# Patient Record
Sex: Male | Born: 1950 | Race: White | Hispanic: No | Marital: Single | Smoking: Never smoker
Health system: Southern US, Community
[De-identification: ages and names within clinical notes are randomized; demographics above are authoritative.]

## PROBLEM LIST (undated history)

## (undated) DIAGNOSIS — E785 Hyperlipidemia, unspecified: Secondary | ICD-10-CM

## (undated) DIAGNOSIS — K219 Gastro-esophageal reflux disease without esophagitis: Secondary | ICD-10-CM

## (undated) DIAGNOSIS — I4510 Unspecified right bundle-branch block: Secondary | ICD-10-CM

## (undated) DIAGNOSIS — Z955 Presence of coronary angioplasty implant and graft: Secondary | ICD-10-CM

## (undated) DIAGNOSIS — E7800 Pure hypercholesterolemia, unspecified: Secondary | ICD-10-CM

## (undated) DIAGNOSIS — K4090 Unilateral inguinal hernia, without obstruction or gangrene, not specified as recurrent: Secondary | ICD-10-CM

## (undated) DIAGNOSIS — M1990 Unspecified osteoarthritis, unspecified site: Secondary | ICD-10-CM

## (undated) DIAGNOSIS — M1A9XX0 Chronic gout, unspecified, without tophus (tophi): Secondary | ICD-10-CM

## (undated) DIAGNOSIS — I10 Essential (primary) hypertension: Secondary | ICD-10-CM

## (undated) DIAGNOSIS — I2510 Atherosclerotic heart disease of native coronary artery without angina pectoris: Secondary | ICD-10-CM

## (undated) DIAGNOSIS — M109 Gout, unspecified: Secondary | ICD-10-CM

## (undated) DIAGNOSIS — I209 Angina pectoris, unspecified: Secondary | ICD-10-CM

## (undated) DIAGNOSIS — I2089 Other forms of angina pectoris: Secondary | ICD-10-CM

## (undated) DIAGNOSIS — I208 Other forms of angina pectoris: Secondary | ICD-10-CM

## (undated) HISTORY — PX: CORONARY ANGIOPLASTY: SHX604

## (undated) HISTORY — DX: Other forms of angina pectoris: I20.8

## (undated) HISTORY — PX: CORONARY ANGIOPLASTY WITH STENT PLACEMENT: SHX49

---

## 1998-07-20 ENCOUNTER — Inpatient Hospital Stay (HOSPITAL_COMMUNITY): Admission: EM | Admit: 1998-07-20 | Discharge: 1998-07-22 | Payer: Self-pay | Admitting: Emergency Medicine

## 1998-07-20 ENCOUNTER — Encounter: Payer: Self-pay | Admitting: Emergency Medicine

## 1998-07-29 ENCOUNTER — Inpatient Hospital Stay (HOSPITAL_COMMUNITY): Admission: AD | Admit: 1998-07-29 | Discharge: 1998-07-31 | Payer: Self-pay | Admitting: Cardiovascular Disease

## 1998-07-31 ENCOUNTER — Encounter: Payer: Self-pay | Admitting: Cardiovascular Disease

## 2001-08-08 ENCOUNTER — Inpatient Hospital Stay (HOSPITAL_COMMUNITY): Admission: EM | Admit: 2001-08-08 | Discharge: 2001-08-10 | Payer: Self-pay | Admitting: Emergency Medicine

## 2001-08-08 ENCOUNTER — Encounter: Payer: Self-pay | Admitting: Cardiovascular Disease

## 2001-08-09 ENCOUNTER — Encounter: Payer: Self-pay | Admitting: Cardiovascular Disease

## 2003-12-28 ENCOUNTER — Emergency Department (HOSPITAL_COMMUNITY): Admission: EM | Admit: 2003-12-28 | Discharge: 2003-12-28 | Payer: Self-pay | Admitting: *Deleted

## 2005-11-09 ENCOUNTER — Emergency Department (HOSPITAL_COMMUNITY): Admission: EM | Admit: 2005-11-09 | Discharge: 2005-11-09 | Payer: Self-pay | Admitting: Emergency Medicine

## 2006-01-06 ENCOUNTER — Emergency Department (HOSPITAL_COMMUNITY): Admission: EM | Admit: 2006-01-06 | Discharge: 2006-01-06 | Payer: Self-pay | Admitting: Emergency Medicine

## 2006-01-19 ENCOUNTER — Inpatient Hospital Stay (HOSPITAL_COMMUNITY): Admission: RE | Admit: 2006-01-19 | Discharge: 2006-01-19 | Payer: Self-pay | Admitting: Cardiovascular Disease

## 2006-05-01 ENCOUNTER — Inpatient Hospital Stay (HOSPITAL_COMMUNITY): Admission: EM | Admit: 2006-05-01 | Discharge: 2006-05-05 | Payer: Self-pay | Admitting: Emergency Medicine

## 2006-05-11 ENCOUNTER — Inpatient Hospital Stay (HOSPITAL_COMMUNITY): Admission: EM | Admit: 2006-05-11 | Discharge: 2006-05-14 | Payer: Self-pay | Admitting: Emergency Medicine

## 2006-05-28 ENCOUNTER — Observation Stay (HOSPITAL_COMMUNITY): Admission: AD | Admit: 2006-05-28 | Discharge: 2006-05-29 | Payer: Self-pay | Admitting: Cardiovascular Disease

## 2007-01-10 ENCOUNTER — Emergency Department (HOSPITAL_COMMUNITY): Admission: EM | Admit: 2007-01-10 | Discharge: 2007-01-10 | Payer: Self-pay | Admitting: Emergency Medicine

## 2007-07-09 ENCOUNTER — Observation Stay (HOSPITAL_COMMUNITY): Admission: EM | Admit: 2007-07-09 | Discharge: 2007-07-10 | Payer: Self-pay | Admitting: Emergency Medicine

## 2010-10-27 NOTE — Discharge Summary (Signed)
[name], [name]             ACCOUNT NO.:  [id number]   MEDICAL RECORD NO.:  [id number]          PATIENT TYPE:  INP   LOCATION:  3739                         FACILITY:  [facility]   PHYSICIAN:  [name], M.D.  DATE OF BIRTH:  19-May-1951   DATE OF ADMISSION:  05/11/2006  DATE OF DISCHARGE:  05/14/2006                               DISCHARGE SUMMARY   PRINCIPAL DIAGNOSES:  1. Native coronary vessel arthrosclerosis.  2. Intermittent coronary syndrome.  3. Anxiety.  4. Hypertension.   DISCHARGE MEDICATIONS:  1. Aspirin 325 mg 1 p.o. daily.  2. Metoprolol 25 mg 1 twice daily.  3. Imdur 60 mg 1 daily.  4. Plavix 75 mg 1 daily.  5. Prevacid 40 mg 1 daily.  6. NitroQuick 0.4 mg 1 sublingual every 5 minutes x3 as needed for      chest pain.  7. Percocet 5/325 mg 1 twice daily as needed.   DISCHARGE ACTIVITIES:  As tolerated.   DISCHARGE DIET:  Low-fat, low-salt diet.   CONDITION ON DISCHARGE:  Improved.   HISTORY:  This 60 year old white male presented with substernal chest  pain along with some right-sided chest pain.  He had approximately three  sublingual nitroglycerin and had recent PTCA and stent placement in his  LAD and diagonal vessel in August 2007 and a PTCA of diagonal vessel in  November 2007.   PHYSICAL EXAMINATION:  VITAL SIGNS:  Pulse 70, respirations 16, blood  pressure 136/84, oxygen saturation 98% on room air, weight 165, and  height 5'6.  GENERAL:  The patient was alert, oriented x3.  HEENT:  Normocephalic, atraumatic, with pupils unequal, reactive to  light.  Extraocular movements intact.  NECK:  No JVD, no carotid bruit, no thyromegaly.  LUNGS:  Clear bilaterally.  HEART:  Normal S1, S2, with grade 2/6 systolic murmur.  ABDOMEN:  Soft.  EXTREMITIES:  No edema, cyanosis, or clubbing.  CNS:  Grossly intact cranial nerves, patient moves all four extremities.   LABORATORY DATA:  Normal hemoglobin, hematocrit, WBC count, platelet  count.  Normal  electrolytes, BUN, creatinine.  INR 1.  EKG was normal  sinus rhythm.  Cardiac enzymes were elevated with minimal CK-MB  elevation and normal troponin-I x2.  Nuclear stress test without  significant ischemia.   HOSPITAL COURSE:  The patient was admitted to telemetry unit, although  he had an elevated total CK, his CK-MB and troponin-I were unremarkable.  The patient underwent nuclear stress test that did not show any  significant reversible ischemia; hence, the patient was discharged home  in satisfactory condition with followup by me in 2 weeks.      [name], M.D.  Electronically Signed     ASK/MEDQ  D:  06/27/2006  T:  06/27/2006  Job:  [id number]

## 2010-10-27 NOTE — Cardiovascular Report (Signed)
[name], [name]             ACCOUNT NO.:  [id number]   MEDICAL RECORD NO.:  [id number]          PATIENT TYPE:  INP   LOCATION:  2922                         FACILITY:  [facility]   PHYSICIAN:  Eduardo Osier. Sharyn Lull, M.D. DATE OF BIRTH:  07-03-1950   DATE OF PROCEDURE:  01/18/2006  DATE OF DISCHARGE:                              CARDIAC CATHETERIZATION   PROCEDURE:  1. Successful PTCA to ostial diagonal one using 2.5 x 8-mm long Voyager      balloon.  2. Successful PTCA to mid-LAD using 2.5 x 12-mm long Voyager balloon.  3. Successful deployment of 3.0 x 13 mm long Cypher drug-eluting stent in      ostial and proximal diagonal one.  4. Successful deployment of 2.75 x 18 mm long Cypher drug-eluting stent in      mid-LAD.  5. Successful post dilatation of this stent using 3.0 x 8-mm long      PowerSail balloon   INDICATIONS FOR PROCEDURE:  [name] is 60 year old white male with past  medical history significant for coronary artery disease and PTCA stenting to  mid-LAD in February of 2000, history of hypercholesteremia, positive family  history of coronary artery disease.  He was admitted by Dr. Algie Coffer as an  outpatient because of recurrent typical anginal chest pain.  The patient  subsequently had cath today by Dr. Algie Coffer which showed bifurcations mid-LAD  stenosis with 85% stenosis in the ostial of diagonal one and 60-70% mid-LAD  InStent restenosis with haziness.  I was called for possible PCI to diagonal  and LAD.  I discussed with the patient, briefly, regarding risks and  benefits of the procedure i.e. death and mild stroke, need for emergency  CABG, risk of restenosis in the range of 20-30%,  local vascular  complications etcetera and consented for the procedure.   DESCRIPTION OF PROCEDURE:  After obtaining the informed consent, a 5-French  arterial sheath was exchanged to 7-French arterial sheath without  difficulty.  Next, an 11-French 3.5 XV guiding catheter was  advanced over  the wire under fluoroscopic guidance up to the ascending aorta.  Wire was  pulled out, the catheter was aspirated and connected to the manifold.  Catheter was further advanced and engaged into left coronary ostium.  Multiple views of this system were obtained.  Findings were as above, i.e.  left main was patent.  LAD has 60-70% InStent and prestent stenosis with  haziness.  Diagonal one was large which has 85% ostial stenosis.  Left  circumflex was patent.  RCA was patent.  LV function was normal.   Interventional procedure successful PTCA to ostial diagonal one was done  using 2.5 x 8-mm long Voyager balloon using double wire technique for  predilatation; and then PTCA to mid-LAD was done using 2.5 x 12-mm long  Voyager balloon using double wire technique as above; and then a 3.0 x 13-mm  long Cypher drug-eluting stent was deployed in the ostial and proximal  diagonal one prior to deployment of the stent.  A 2.75 x 13-mm long Voyager  balloon was placed in LAD.  The stent was pulled  back up to the ostium of  the diagonal and was deployed at 13 atmospheres pressure.  The stent was  postdilated using 3.0 x 8 mm long PowerSail balloon going up to 16  atmospheres of pressure; and then 2.75 x 18 mm long Cypher drug-eluting  stent was deployed at 15 atmospheres pressure.  In mid-LAD stent was  postdilated using same 3.0 x 8-mm long PowerSail balloon going up to 18  atmospheres pressure.  Lesions were dilated in diagonal one from 85% to less  than 10% residual; and in LAD from 70% to less than 10% residual with  excellent TIMI grade 3 distal flow without evidence of dissection or distal  embolization.  The patient received weight-based heparin Integrilin and 600  mg of Plavix during the procedure.  The patient tolerated procedure well.  There are no complications.  The patient was transferred to recovery room in  stable condition.           ______________________________   Eduardo Osier Sharyn Lull, M.D.     MNH/MEDQ  D:  01/18/2006  T:  01/19/2006  Job:  [id number]   cc:   Cath Lab  [name], M.D.

## 2010-10-27 NOTE — Cardiovascular Report (Signed)
[name], [name]             ACCOUNT NO.:  [id number]   MEDICAL RECORD NO.:  [id number]          PATIENT TYPE:  INP   LOCATION:  6529                         FACILITY:  [facility]   PHYSICIAN:  Mohan N. Sharyn Lull, M.D. DATE OF BIRTH:  [birth year]   DATE OF PROCEDURE:  05/01/2006  DATE OF DISCHARGE:                              CARDIAC CATHETERIZATION   PROCEDURE:  Successful PTCA to ostial of diagonal-1 using initially 2.5 x 8  mm long Voyager balloon and then 3.0 x 6 mm long cutting balloon.   INDICATION FOR THE PROCEDURE:  [name] is a 60 year old white male with  past medical history significant for coronary artery disease.  He had PTCA  to LAD in February 2000 and subsequently had restenosis requiring restenosis  and new ostial diagonal-1 stenosis requiring PTCA stenting to mid-LAD and  diagonal-1 in August 2007.  History of hypertension, hypercholesterolemia.  Was admitted by Dr. Algie Coffer this morning because of exertional chest pain  which started around 5 a.m.  He took three sublingual nitro with partial  relief.  Due to typical anginal chest pain and multiple risk factors, the  patient underwent left cardiac catheterization by Dr. Algie Coffer which showed  LAD had 20 to 30% in-stent restenosis diagonal and ostial diagonal-1 is 90%  in-stent focal restenosis.  The circumflex was patent.  RCA was patent.  I  was called for possible PCI to ostial diagonal-1.   Discussed with the patient briefly the catheterization findings and PTCA of  ostial diagonal, its risks and benefits, i.e., death, MI, stroke, need for  emergency CABG and restenosis in the range of 30 to 40%, local vascular  complications, etc., and consented for the procedure.   PROCEDURE:  After obtaining informed consent, a 5-French arterial sheath was  exchanged for a 7-French arterial sheath over the wire without difficulty.  Next, a 3.5, 7-French XB catheter was advanced over the wire under  fluoroscopic guidance to  the ascending aorta.  The wire was pulled out, the  catheter was aspirated and connected to the manifold.  The catheter was  further advanced and engaged into the left coronary ostium.  Multiple views  of the left system were taken.   FINDINGS:  As above.   INTERVENTIONAL PROCEDURES:  Successful PTCA to ostial diagonal-1 was done.  Initially, she was in 2.5 x 8 mm long Voyager balloon as the cutting balloon  could not be tracked down for predilatation, going up to 8 atmospheric  pressure.  Next, PTCA to ostial diagonal was done using 3.0 x 6 mm long  cutting balloon going up to 6 atmospheric pressure.  Multiple invasions were  done.  The lesion was dilated from 90% to less than 20% residual  with excellent TIMI grade 3 distal flow without evidence of dissection nor  distal embolization.  The patient received weight-based Angiomax, 300 mg of  Plavix during the procedure.  The patient tolerated the procedure well.  There were no complications.  The patient was transferred to the recovery  room in stable condition.           ______________________________  Marlane Mingle  Jeoffrey Massed, M.D.     MNH/MEDQ  D:  05/01/2006  T:  05/01/2006  Job:  [id number]   cc:   [name], M.D.  Catheterization Laboratory

## 2010-10-27 NOTE — Discharge Summary (Signed)
NAME:  [name], Donald B.          ACCOUNT NO.:  [id number]   MEDICAL RECORD NO.:  [id number]          PATIENT TYPE:  OBV   LOCATION:  4731                         FACILITY:  [facility]   PHYSICIAN:  [name], M.D.  DATE OF BIRTH:  February 05, 1951   DATE OF ADMISSION:  05/28/2006  DATE OF DISCHARGE:  05/29/2006                               DISCHARGE SUMMARY   DISCHARGE DIAGNOSES:  1. Cardiac chest pain.  2. Anxiety.  3. Native vessel coronary artery disease.  4. Status post angioplasty.   MEDICATIONS ON DISCHARGE:  Aspirin 325 mg one daily; Plavix 75 mg one  daily; lisinopril 5 mg one daily; metoprolol 25 mg one-half tablet twice  daily; Pravachol 40 mg one daily; Imdur 60 mg two daily; Prilosec 20 mg  one daily; Percocet 5/325 mg twice daily; Xanax 0.25 mg one at bedtime;  nitroglycerin 0.4 mg one sublingual every 5 minutes x three as needed  for chest pain.   DISCHARGE DIET:  Low sodium, heart healthy diet.   WOUND CARE:  Not applicable.   DISCHARGE ACTIVITIES:  The patient is to increase activities slowly.   CONDITION ON DISCHARGE:  Improved.   FOLLOW-UP:  Follow-up by Dr. [name], in 2-4 weeks, the patient  to call [phone] for an appointment.   HISTORY:  This 60 year old white male presented with right-sided chest  pain x two days.  The patient had a recent angioplasty and repeat  cardiac catheterization that had shown patent vessels.   PHYSICAL EXAMINATION:  VITAL SIGNS:  Pulse 60, respiratory rate 16,  blood pressure 130/80, oxygen saturation 98% on room air.  Weight  approximately 165 pounds, height 5 feet 6 inches.  GENERAL APPEARANCE:  The patient is alert and oriented x 3.  HEENT:  The patient is normocephalic, atraumatic, with conjunctivae  pink, sclerae nonicteric.  Pupils are unequal but reacting to light.  Extraocular movements are intact.  NECK:  No JVD, no carotid bruit.  Full range of motion of the neck.  LUNGS:  Clear bilaterally.  HEART:   Normal S1, S2, with a grade I/VI systolic murmur.  ABDOMEN:  The abdomen is soft and nontender.  EXTREMITIES:  No clubbing, cyanosis or edema.  CENTRAL NERVOUS SYSTEM:  Cranial nerves are grossly intact.  The patient  moves all four extremities.   LABORATORY INVESTIGATIONS:  Laboratory data revealed a normal  hemoglobin, hematocrit, white blood cell count, platelet count.  Normal  electrolytes, BUN and creatinine.  CK-MB, troponin I normal x two.   EKG with normal sinus rhythm.   Ultrasound of the abdomen negative for gallbladder disease.   HOSPITAL COURSE:  The patient was admitted to the telemetry unit.  His  CK-MB and troponin I were normal x two.  His EKG was with normal sinus  rhythm.  His ultrasound of the abdomen was negative for gallbladder  disease.  The patient was started on Xanax and he ambulated well without  any additional chest pain, and he was discharged home in satisfactory  condition with follow-up by me in two weeks.      [name], M.D.  Electronically  Signed     ASK/MEDQ  D:  09/18/2006  T:  09/19/2006  Job:  [id number]

## 2010-10-27 NOTE — Cardiovascular Report (Signed)
[name], [name]             ACCOUNT NO.:  [id number]   MEDICAL RECORD NO.:  [id number]          PATIENT TYPE:  INP   LOCATION:  3733                         FACILITY:  [facility]   PHYSICIAN:  [name], M.D.  DATE OF BIRTH:  Feb 05, 1951   DATE OF PROCEDURE:  05/03/2006  DATE OF DISCHARGE:                            CARDIAC CATHETERIZATION   PROCEDURE:  Done by Dr. Orpah Cobb.  Left heart catheterization, selective coronary angiography.   INDICATIONS:  This 60 year old white male had recurrent chest pain after  recent PTCA of diagonal vessel.   APPROACH:  Right femoral artery using 4-French sheath and catheters.   COMPLICATIONS:  None.   HEMODYNAMIC DATA:  The aortic pressure was 92/59.   CORONARY ANATOMY:  The left main coronary artery was unremarkable.   Left anterior descending coronary artery:  The left anterior descending  artery was a relatively smaller vessel and had a patent stent in the mid-  vessel area.   The diagonal vessel:  The diagonal vessel had a patent stent.  Pre-stent  ostial 50% narrowing.  This is probably elastic recoil and otherwise had  a good flow.   Left circumflex coronary artery:  The left circumflex artery was  unremarkable.   Right coronary artery:  The right coronary artery was dominant and  unremarkable.   IMPRESSION:  1. Patent diagonal stent with a moderate pre-stent ostial stenosis.  2. Patent left anterior descending stent.   RECOMMENDATIONS:  This patient will continue IV heparin, IV  nitroglycerin, and we will add IV Integrilin and IV fluids as needed.      [name], M.D.  Electronically Signed     ASK/MEDQ  D:  05/03/2006  T:  05/03/2006  Job:  [id number]

## 2010-10-27 NOTE — Discharge Summary (Signed)
[name], [name]             ACCOUNT NO.:  [id number]   MEDICAL RECORD NO.:  [id number]          PATIENT TYPE:  INP   LOCATION:  2922                         FACILITY:  [facility]   PHYSICIAN:  [name], M.D.  DATE OF BIRTH:  11-Aug-1950   DATE OF ADMISSION:  01/18/2006  DATE OF DISCHARGE:  01/19/2006                                 DISCHARGE SUMMARY   PRINCIPAL DIAGNOSES:  1. Multivessel, native vessel coronary artery disease.  2. Anginal pectoris.  3. Percutaneous transluminal coronary artery angioplasty status.  4. Pure hypercholesterolemia.   PRINCIPAL PROCEDURE:  Left heart catheterizations, selective coronary  angiography, left ventricular function study done by Dr. Orpah Cobb on  January 18, 2006.  PTCA and Cypher Stent placement in diagonal and LAD by Dr.  Rinaldo Cloud.   DISCHARGE MEDICATIONS:  1. Aspirin 81 mg 2 daily.  2. Plavix 75 mg 1 daily.  3. Prevacid 40 mg 1 daily.  4. Prilosec 20 mg 1 daily.  5. Imdur 60 mg 1 daily.  6. Nitroglycerin 0.4 mg tablet 1 sublingual every 5 minutes x3 as need for      chest pain.  7. Patient to discontinue Ranexa.   FOLLOWUP:  Follow up with Dr. Algie Coffer in 2 weeks.  Patient to call [phone]  for appointment.   DISCHARGE DIET:  Low-fat, low-salt diet with extra fluid.   DISCHARGE ACTIVITY:  Patient to increase activity slowly.  No driving,  lifting, pulling, pushing, for 1 week.   WOUND CARE INSTRUCTIONS:  Patient to notify if he has right groin pain,  swelling, or discharge.   HISTORY:  This 60 year old white male had a recurrent chest pain in spite of  using more medications including Ranexa.  Patient had a PTCA of the LAD done  in February 2000.   PHYSICAL EXAMINATION:  VITAL SIGNS:  Pulse 76, respirations 18, blood  pressure 149/94, oxygen saturation 98%.  GENERAL:  Height 5 feet, 6 inches and weighs approximately 165 pounds.  Patient is alert and oriented x3.  HEENT:  Patient is normocephalic, atraumatic.   Pupils reactive to light, but  unequal pupils.  NECK:  No JVD.  No carotid bruit.  LUNGS:  Clear bilaterally.  HEART:  Normal S1, S2.  EXTREMITIES:  No edema, cyanosis, or clubbing.   LABORATORY DATA:  Normal hemoglobin, hematocrit, WBC count, platelet count.  Normal PT/INR and PTT.  Normal electrolytes, BUN 18, creatinine borderline  at 1.6.  CK total 121, MB 3.   EKG:  Normal sinus rhythm with left axis deviation and nonspecific ST wave  changes.   Cardiac catheterization shows a significant LAD and diagonal vessel disease.   Angioplasty, by Dr. Rinaldo Cloud, reducing the diagonal vessel, 90% lesion  to 0% with a 3 mm x 13 mm long CYPHER stent, and angioplasty with stent  placement of left anterior descending coronary artery disease reducing it to  0% using a 2.75 mm x 18 mm long Cypher stent.   HOSPITAL COURSE:  Patient was admitted to angioplasty unit after a cardiac  catheterization showing severe LAD and diagonal vessel disease.  PTCA stent  procedure was done by Dr. Rinaldo Cloud where a drug-eluting stent was  placed in the diagonal vessel and LAD vessel using 18 mm long Cypher stent.  Patient had good results and had no significant post procedure complication.  His creatinine remains stable at 1.6.  His activity was increased and he was  discharged home in satisfactory condition with a follow up in 2 weeks.      [name], M.D.  Electronically Signed     ASK/MEDQ  D:  03/12/2006  T:  03/13/2006  Job:  [id number]

## 2010-10-27 NOTE — Discharge Summary (Signed)
[name], [name]             ACCOUNT NO.:  [id number]   MEDICAL RECORD NO.:  [id number]          PATIENT TYPE:  INP   LOCATION:  3733                         FACILITY:  [facility]   PHYSICIAN:  [name], M.D.  DATE OF BIRTH:  [birth year]   DATE OF ADMISSION:  05/01/2006  DATE OF DISCHARGE:  05/05/2006                               DISCHARGE SUMMARY   PRINCIPAL DIAGNOSES:  1. Arthrosclerosis of native coronary vessel.  2. Intermittent coronary syndrome.  3. Hypertension.  4. Hypercholesterolemia.  5. Status post percutaneous transluminal coronary angioplasty.  6. Anxiety.   PRINCIPAL PROCEDURE:  Left heart catheterization, selective coronary  angiography, left ventricular function study done by Dr. Orpah Cobb on  May 03, 2006, and left heart catheterization with angioplasty done  Dr. Rinaldo Cloud on May 01, 2006.   DISCHARGE MEDICATIONS:  1. Aspirin 325 mg 1 daily.  2. Plavix 75 mg 1 daily.  3. Metoprolol 25 mg 1/2 twice daily.  4. Vytorin 10/20 mg 1 daily.  5. Imdur 60 mg 1 daily.  6. Nitrostat 0.4 mg 1 sublingual as needed for chest pain.   DISCHARGE DIET:  Low-fat, low-salt diet.   DISCHARGE ACTIVITIES:  Patient to avoid lifting, driving, and sexual  activity for 1 week.   WOUND CARE:  Patient to notify right groin pain, swelling, or discharge.   FOLLOWUP:  Follow up with Dr. Orpah Cobb in 1 week.  Patient to call  [phone] for appointment.   HISTORY:  This 60 year old white male presented with retrosternal chest  pain, although it typically moves to the right side, had a partial  relief with sublingual nitroglycerin use.  Had a recent PTCA stent  placement in LAD diagonal in August 2007.   PHYSICAL EXAMINATION:  VITAL SIGNS:  Pulse 76, respirations 24, blood  pressure 147/85, temperature 97.5, oxygen saturation 98%, height 5'6,  weight approximately 165 pounds.  GENERAL:  Patient was alert, oriented x3.  HEENT:  Patient is normocephalic,  atraumatic.  Eyes:  Pupils equal,  reactive to light, but unequal pupil size.  NECK:  No JVD, no carotid bruits, no thyromegaly.  LUNGS:  Clear bilaterally.  HEART:  Normal S1 S2.  ABDOMEN:  Soft and nontender.  EXTREMITIES:  No edema, cyanosis, or clubbing.  CNS:  Cranial nerves grossly intact.   LABORATORY DATA:  Normal sodium, potassium, BUN, creatinine.  INR 1.  Normal hemoglobin, hematocrit.  EKG normal sinus rhythm with left axis  deviation.  Cardiac catheterization revealed a severe diagonal stent  stenosis and patent LAD stent site with a good left ventricular systolic  function.  Angioplasty of diagonal vessel was done by Dr. Rinaldo Cloud  using 2.5 mm x 8 mm Ranger balloon followed by 3 mm x 6 mm long cutting  balloon.   HOSPITAL COURSE:  Patient was admitted to telemetry unit, myocardial  infarction was ruled out.  He underwent a cardiac catheterization that  showed a severe diagonal vessel ostial stenosis.  This was successfully  with a cutting balloon by Dr. Rinaldo Cloud.  Patient had a unremarkable  post procedure stay until May 03, 2006,  when he had recurrence of  chest pain.  Repeat cardiac catheterization showed patent diagonal stent  with a moderate restenosis.  Patient was placed on IV heparin for 48  hours and IV nitroglycerin.  He was monitored for additional 24-48  hours.  His condition remained stable, and he was discharged home in  satisfactory condition with followup by me in 2 weeks.      [name], M.D.  Electronically Signed     ASK/MEDQ  D:  06/27/2006  T:  06/27/2006  Job:  [id number]

## 2010-10-27 NOTE — Discharge Summary (Signed)
[name], [name]             ACCOUNT NO.:  [id number]   MEDICAL RECORD NO.:  [id number]          PATIENT TYPE:  OBV   LOCATION:  2007                         FACILITY:  [facility]   PHYSICIAN:  [name], M.D.  DATE OF BIRTH:  1950-11-08   DATE OF ADMISSION:  07/09/2007  DATE OF DISCHARGE:  07/10/2007                               DISCHARGE SUMMARY   FINAL DIAGNOSES:  1. Coronary atherosclerosis of native coronary vessel.  2. Angina.  3. Percutaneous transluminal coronary angioplasty status.  4. Pure hypercholesterolemia.   DISCHARGE MEDICATIONS:  1. Metoprolol 25 mg twice daily.  2. Imdur 60 mg twice daily.  3. Lisinopril 5 mg one daily.  4. Pravastatin 40 mg one daily.  5. Aspirin 325 mg one daily.  6. Nitroglycerin 0.4 mg tablet one sublingual every 5 minutes x3 as      needed for chest pain.  7. Flexeril 10 mg half a tablet at bedtime.  8. Percocet 5/325 mg one twice daily as needed.   DISCHARGE ACTIVITY:  Patient to increase activity slowly and stop any  activity that causes chest pain, shortness of breath, dizziness,  sweating or excessive weakness.   DISCHARGE DIET:  Low-sodium, heart-healthy diet.   FOLLOW-UP:  By Dr. Orpah Cobb in 2-4 weeks.   HISTORY:  This 60 year old white male presented with substernal chest  pain without exertion and partially improving with three sublingual  nitroglycerin.  The patient has known history of coronary artery disease  and had angioplasty done in 2007.   PHYSICAL EXAMINATION:  Temperature 97.5, pulse 65, respirations 18,  blood pressure 149.95.  GENERAL:  The patient is averagely built and nourished  HEENT:  The patient is normocephalic, atraumatic with pupils reactive to  light, extraocular movement intact.  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  NECK:  No JVD, no carotid bruit.  LUNGS:  Clear bilaterally.  HEART:  Normal S1 and S2.  ABDOMEN:  Soft and nontender.  EXTREMITIES:  No edema, cyanosis,  clubbing.  CNS: The patient moves all four extremities.   LABORATORY DATA:  Near-normal electrolytes, BUN and creatinine  borderline, glucose normal.  Hemoglobin/hematocrit normal, WBC count and  platelet count normal.  Cardiac enzymes normal.  EKG:  Normal sinus  rhythm with nonspecific T-wave changes.  A nuclear stress test showed  ejection fraction of 77% and no area of reversibility.   HOSPITAL COURSE:  The patient was admitted to telemetry unit.  Myocardial infarction was ruled out.  He underwent nuclear stress test  that failed to show any reversible ischemia and then was started on  analgesic and anti-anxiety medications, and he will be followed by me in  2 weeks.      [name], M.D.  Electronically Signed     ASK/MEDQ  D:  07/30/2007  T:  07/31/2007  Job:  [id number]

## 2010-10-27 NOTE — H&P (Signed)
[name], [name]             ACCOUNT NO.:  [id number]   MEDICAL RECORD NO.:  [id number]          PATIENT TYPE:  OBV   LOCATION:  4731                         FACILITY:  [facility]   PHYSICIAN:  [name], M.D.  DATE OF BIRTH:  [birth year]   DATE OF ADMISSION:  05/28/2006  DATE OF DISCHARGE:                              HISTORY & PHYSICAL   CHIEF COMPLAINT:  Chest pain.   HISTORY OF PRESENT ILLNESS:  This is a 60 year old white male who  complains of a dull, retrosternal and right-sided chest pain, has  variable response to nitroglycerin.  Had recent PTCA stent placement in  LAD and diagonal in August of 2007 with a distal PTCA of diagonal vessel  in November of 2007.  Patient denies any fever or chills, cough or cold  and nausea or vomiting or shortness of breath.   PAST MEDICAL HISTORY:  Negative for diabetes, hypertension, smoking.  Positive for alcohol.  Negative for drug abuse.  Negative for elevated  cholesterol level, myocardial infarction.  Positive for family history  of premature coronary artery disease to father.   PAST SURGICAL HISTORY:  PTCA to LAD in 2002 and August of 2007 and in  November of 2007 for PTCA to diagonal vessel.   CURRENT MEDICATIONS:  Include:  1. Aspirin 325 mg 1 daily.  2. Plavix 75 mg 1 daily.  3. Metoprolol 25 mg twice daily.  4. Imdur 60 mg 2 daily.  5. Pravastatin 40 mg daily.  6. Nitroglycerin 0.4 mg 1 sublingual q.5 minutes x3 as needed for      chest pain.   ALLERGIES:  NO KNOWN DRUG ALLERGIES.   PERSONAL HISTORY:  Patient is single and a Designer, television/film set.   FAMILY HISTORY:  Father died at age 36 of myocardial infarction.  Patient has 3 brothers.   REVIEW OF SYSTEMS:  Denies recent weight gain or weight loss.  Wears  glasses.  Has a left eye injury 25 years ago.  Has recurrent chest pain.  No history of asthma, COPD, palpitations, leg swelling, GI bleed,  hepatitis, blood transfusion, kidney stone, stroke, seizures or  psychiatric  admissions.   PHYSICAL EXAMINATION:  VITAL SIGNS:  Pulse 60.  Respirations 16.  Blood  pressure 130/80.  Oxygen saturation 98% on room air.  Weighs  approximately 165 pounds.  Height 5 feet 6 inches.  GENERAL:  Patient is alert and oriented x3.  HEENT:  Patient is normocephalic, atraumatic.  Pupils equal and reactive  to light.  Extraocular movement intact.  Conjunctivae are pink.  Sclerae  are nonicteric.  He has unequal pupils, but reacting to light.  NECK:  No JVD.  No carotid bruit.  No thyromegaly.  Full range of motion  of the neck.  LUNGS:  Clear bilaterally.  HEART:  Normal S1, S2 with a grade 1/6 systolic murmur.  ABDOMEN:  Soft and nontender.  EXTREMITIES:  No edema, cyanosis or clubbing.  CNS:  Cranial nerves grossly intact.  Patient moves all 4 extremities.   LABORATORY DATA:  Revealed normal hemoglobin, hematocrit, WBC count and  platelet count.  Normal electrolytes, BUN  and creatinine.  CK-MB,  troponin-I normal x2.   EKG:  Normal sinus rhythm.   Ultrasound of the abdomen:  Negative for gallbladder disease.   IMPRESSION:  1. Chest pain.  Rule out myocardial infarction.  2. Coronary artery disease.  3. Rule out gallbladder disease.  4. Anxiety.   PLAN:  Patient placed in observation.  Run cardiac enzymes.  Do  ultrasound of the abdomen.  Start IV heparin.  Adjust medications and if  studies are negative, discharge patient with use of analgesic and  antianxiety medications, along with his current medications.      [name], M.D.  Electronically Signed     ASK/MEDQ  D:  05/29/2006  T:  05/29/2006  Job:  [id number]

## 2010-10-27 NOTE — Discharge Summary (Signed)
[place]  Patient:    [name], [name] Visit Number: [id number] MRN: [id number]          Service Type: MED Location: [phone] Attending Physician:  [name] Dictated by:   [name], M.D. Proc. Date: 08/08/01 Admit Date:  08/08/2001 Discharge Date: 08/10/2001                             Discharge Summary  PRINCIPAL DIAGNOSES: 1. Angina. 2. Coronary artery disease.  DISCHARGE MEDICATIONS: 1. Imdur 30 mg one daily. 2. Pravachol 20 mg one daily. 3. Pepcid Complete one daily. 4. Coated aspirin 81 mg one daily.  DISCHARGE ACTIVITY:  As tolerated.  DIET:  Low-fat, low-salt diet.  WOUND CARE:  Not applicable.  FOLLOW-UP:  With Dr. Orpah Cobb, in one week.  Patient to call [phone] for appointment.  HISTORY OF PRESENT ILLNESS:  This 60 year old white male had substernal pressure-type pain, which was nonradiating, without any sweating spell, however was associated with shortness of breath and treated by sublingual nitroglycerin and nitroglycerin drip in the emergency room.  The patient does not have any significant cardiac risk factors, however had a PTCA LAD done in February 2000 and currently he was taking only aspirin 81 mg daily. He had stopped Lipitor due to muscle aches and pains.  PHYSICAL EXAMINATION:  VITAL SIGNS:  Temperature 98, pulse 78, respirations 18, blood pressure 114/73.  Height 5 feet 6 inches, weight 175 pounds.  GENERAL:  The patient was alert and oriented x 3.  HEENT:  Head normocephalic and atraumatic.  Eyes:  _____, extraocular movements intact.  Unequal pupils with the left 8 mm and right 2 mm.  Ears, nose, throat:  Mucous membranes pink and moist.  NECK:  No JVD, no carotid bruit.  CHEST:  Lungs clear to auscultation bilaterally.  CARDIAC:  Normal S1, S2, with no murmur, gallop, or rub.  ABDOMEN:  Soft and nontender.  EXTREMITIES:  No edema, cyanosis, clubbing.  NEUROLOGIC:   Cranial nerves II-XII grossly intact.  LABORATORY DATA:  Normal hemoglobin, hematocrit, WBC count, and platelet count.  Normal electrolytes, BUN, creatinine.  Normal CK-MB and troponin. PT normal.  EKG normal sinus rhythm.  Nuclear stress test negative for any reversible ischemia, with ejection fraction of 55%.  HOSPITAL COURSE:  The patient was admitted to rule out MI observation unit. Myocardial infarction was ruled out.  He underwent adenosine Cardiolite stress test on August 09, 2001.  This was negative for any significant ischemia; hence, his nitroglycerin and heparin were discontinued.  He ambulated well without any additional chest pain.  He was discharged home in satisfactory condition with the addition of Pravachol and Pepcid Complete and Imdur 30 mg one daily. If the patients symptoms recur, the patient may undergo cardiac catheterization. Dictated by:   [name], M.D. Attending Physician:  [name] DD:  08/19/01 TD:  08/21/01 Job: [id number]

## 2011-03-01 LAB — DIFFERENTIAL
Basophils Absolute: 0
Basophils Absolute: 0
Basophils Relative: 0
Basophils Relative: 1
Eosinophils Absolute: 0.1
Eosinophils Absolute: 0.2
Eosinophils Relative: 2
Monocytes Absolute: 0.8
Monocytes Absolute: 0.9
Neutro Abs: 5.1
Neutro Abs: 5.2

## 2011-03-01 LAB — LIPID PANEL: Triglycerides: 162 — ABNORMAL HIGH

## 2011-03-01 LAB — CK TOTAL AND CKMB (NOT AT ARMC)
CK, MB: 1.2
Relative Index: 1.1
Relative Index: INVALID
Total CK: 114
Total CK: 70
Total CK: 71

## 2011-03-01 LAB — CBC
HCT: 41.7
Hemoglobin: 14.3
MCHC: 34.2
MCHC: 34.2
MCV: 89.2
RDW: 12.6
RDW: 12.8
WBC: 7.5

## 2011-03-01 LAB — I-STAT 8, (EC8 V) (CONVERTED LAB)
Bicarbonate: 24.6 — ABNORMAL HIGH
Bicarbonate: 24.7 — ABNORMAL HIGH
Chloride: 107
HCT: 44
Hemoglobin: 15
Operator id: 198171
Potassium: 7.6
Sodium: 133 — ABNORMAL LOW
Sodium: 134 — ABNORMAL LOW
TCO2: 26
pH, Ven: 7.414 — ABNORMAL HIGH

## 2011-03-01 LAB — POCT CARDIAC MARKERS
CKMB, poc: 1
Myoglobin, poc: 98.8
Operator id: 198171
Troponin i, poc: <0.05

## 2011-03-01 LAB — HEPARIN LEVEL (UNFRACTIONATED): Heparin Unfractionated: 0.84 — ABNORMAL HIGH

## 2011-03-26 LAB — I-STAT 8, (EC8 V) (CONVERTED LAB)
Acid-base deficit: 3 — ABNORMAL HIGH
Chloride: 107
HCT: 46
Operator id: 294501
Potassium: 4.1
TCO2: 23

## 2011-03-26 LAB — POCT I-STAT CREATININE
Creatinine, Ser: 1.4
Operator id: 294501

## 2012-11-08 ENCOUNTER — Encounter (HOSPITAL_COMMUNITY): Payer: Self-pay | Admitting: Emergency Medicine

## 2012-11-08 ENCOUNTER — Emergency Department (HOSPITAL_COMMUNITY)
Admission: EM | Admit: 2012-11-08 | Discharge: 2012-11-08 | Disposition: A | Discharge status: Home / Self Care | Payer: Self-pay | Attending: Emergency Medicine | Admitting: Emergency Medicine

## 2012-11-08 ENCOUNTER — Emergency Department (HOSPITAL_COMMUNITY): Payer: Self-pay

## 2012-11-08 DIAGNOSIS — I10 Essential (primary) hypertension: Secondary | ICD-10-CM | POA: Insufficient documentation

## 2012-11-08 DIAGNOSIS — Z7982 Long term (current) use of aspirin: Secondary | ICD-10-CM | POA: Insufficient documentation

## 2012-11-08 DIAGNOSIS — I2510 Atherosclerotic heart disease of native coronary artery without angina pectoris: Secondary | ICD-10-CM | POA: Insufficient documentation

## 2012-11-08 DIAGNOSIS — R0789 Other chest pain: Secondary | ICD-10-CM | POA: Insufficient documentation

## 2012-11-08 DIAGNOSIS — Z79899 Other long term (current) drug therapy: Secondary | ICD-10-CM | POA: Insufficient documentation

## 2012-11-08 HISTORY — DX: Atherosclerotic heart disease of native coronary artery without angina pectoris: I25.10

## 2012-11-08 HISTORY — DX: Essential (primary) hypertension: I10

## 2012-11-08 LAB — COMPREHENSIVE METABOLIC PANEL
AST: 23 U/L (ref 0–37)
Albumin: 4 g/dL (ref 3.5–5.2)
BUN: 21 mg/dL (ref 6–23)
Chloride: 100 mEq/L (ref 96–112)
Creatinine, Ser: 1.38 mg/dL — ABNORMAL HIGH (ref 0.50–1.35)
Potassium: 4.6 mEq/L (ref 3.5–5.1)
Total Bilirubin: 0.6 mg/dL (ref 0.3–1.2)
Total Protein: 7.5 g/dL (ref 6.0–8.3)

## 2012-11-08 LAB — CBC
MCHC: 35.3 g/dL (ref 30.0–36.0)
MCV: 87.4 fL (ref 78.0–100.0)
Platelets: 290 10*3/uL (ref 150–400)
RDW: 13.7 % (ref 11.5–15.5)
WBC: 8.2 10*3/uL (ref 4.0–10.5)

## 2012-11-08 LAB — POCT I-STAT TROPONIN I
Troponin i, poc: 0 ng/mL (ref 0.00–0.08)
Troponin i, poc: 0 ng/mL (ref 0.00–0.08)

## 2012-11-08 MED ORDER — NITROGLYCERIN 0.4 MG SL SUBL
0.4000 mg | SUBLINGUAL_TABLET | SUBLINGUAL | Status: DC | PRN
Start: 2012-11-08 — End: 2012-11-08
  Administered 2012-11-08: 0.4 mg via SUBLINGUAL

## 2012-11-08 MED ORDER — GI COCKTAIL ~~LOC~~
30.0000 mL | Freq: Once | ORAL | Status: AC
  Administered 2012-11-08: 30 mL via ORAL
  Filled 2012-11-08: qty 30

## 2012-11-08 MED ORDER — KETOROLAC TROMETHAMINE 30 MG/ML IJ SOLN
30.0000 mg | Freq: Once | INTRAMUSCULAR | Status: AC
  Administered 2012-11-08: 30 mg via INTRAVENOUS
  Filled 2012-11-08: qty 1

## 2012-11-08 MED ORDER — HYDROCODONE-ACETAMINOPHEN 5-325 MG PO TABS: ORAL_TABLET | ORAL | Status: DC

## 2012-11-08 NOTE — ED Provider Notes (Signed)
Medical screening examination/treatment/procedure(s) were performed by non-physician practitioner and as supervising physician I was immediately available for consultation/collaboration.   Gavin Pound. Shaquna Geigle, MD 11/08/12 1701

## 2012-11-08 NOTE — ED Notes (Signed)
Pt c/o mid sternal CP with radiation to right side starting this am

## 2012-11-08 NOTE — ED Notes (Signed)
NAD noted at time of d/c home 

## 2012-11-08 NOTE — ED Provider Notes (Signed)
History     CSN: [id number]  Arrival date & time 11/08/12  1052   First MD Initiated Contact with Patient 11/08/12 1112      Chief Complaint  Patient presents with  . Chest Pain    (Consider location/radiation/quality/duration/timing/severity/associated sxs/prior treatment) HPI Comments: Patient with known history of CAD, stent placement in 2000, PTCA stent placement in LAD and diagonal in August of 2007 with a distal PTCA of diagonal vessel in November of 2007 -- presents with complaint of chest pain. Approximately one hour prior to arrival patient began having right-sided, sharp, 7/10 chest pain. Nothing makes the pain worse but shrugging his shoulders makes it better. It is not associated with diaphoresis, nausea, vomiting, shortness of breath, palpitations. Patient states that it does not feel like his previous cardiac chest pain. Patient was at rest when the pain began. It does not radiate. Onset of symptoms acute. Course is gradually improving, now 3/10. Patient is on daily Imdur for control of angina. He took #2 324 milligram aspirins this morning.  Patient is a 62 y.o. male presenting with chest pain. The history is provided by the patient and medical records.  Chest Pain Associated symptoms: no abdominal pain, no back pain, no cough, no diaphoresis, no fever, no nausea, no palpitations, no shortness of breath and not vomiting     Past Medical History  Diagnosis Date  . Hypertension   . CAD (coronary artery disease)     History reviewed. No pertinent past surgical history.  History reviewed. No pertinent family history.  History  Substance Use Topics  . Smoking status: Never Smoker   . Smokeless tobacco: Not on file  . Alcohol Use: Yes     Comment: occ      Review of Systems  Constitutional: Negative for fever and diaphoresis.  HENT: Negative for neck pain.   Eyes: Negative for redness.  Respiratory: Negative for cough and shortness of breath.   Cardiovascular:  Positive for chest pain. Negative for palpitations and leg swelling.  Gastrointestinal: Negative for nausea, vomiting and abdominal pain.  Genitourinary: Negative for dysuria.  Musculoskeletal: Negative for back pain.  Skin: Negative for rash.  Neurological: Negative for syncope and light-headedness.    Allergies  Review of patient's allergies indicates no known allergies.  Home Medications   Current Outpatient Rx  Name  Route  Sig  Dispense  Refill  . aspirin EC 325 MG tablet   Oral   Take 325 mg by mouth daily.         . isosorbide mononitrate (IMDUR) 60 MG 24 hr tablet   Oral   Take 60 mg by mouth 2 (two) times daily.         Marland Kitchen lisinopril (PRINIVIL,ZESTRIL) 10 MG tablet   Oral   Take 10 mg by mouth 2 (two) times daily.         . metoprolol tartrate (LOPRESSOR) 25 MG tablet   Oral   Take 12.5 mg by mouth 2 (two) times daily.         . pravastatin (PRAVACHOL) 40 MG tablet   Oral   Take 40 mg by mouth 2 (two) times daily.         Marland Kitchen HYDROcodone-acetaminophen (NORCO/VICODIN) 5-325 MG per tablet      Take 1-2 tablets every 6 hours as needed for severe pain   8 tablet   0     BP 135/83  Pulse 68  Temp(Src) 98.1 F (36.7 C) (Oral)  Resp 18  SpO2 99%  Physical Exam  Nursing note and vitals reviewed. Constitutional: He appears well-developed and well-nourished.  HENT:  Head: Normocephalic and atraumatic.  Mouth/Throat: Mucous membranes are normal. Mucous membranes are not dry.  Eyes: Conjunctivae are normal.  Neck: Trachea normal and normal range of motion. Neck supple. Normal carotid pulses and no JVD present. No muscular tenderness present. Carotid bruit is not present. No tracheal deviation present.  Cardiovascular: Normal rate, regular rhythm, S1 normal, S2 normal, normal heart sounds and intact distal pulses.  Exam reveals no distant heart sounds and no decreased pulses.   No murmur heard. Pulmonary/Chest: Effort normal and breath sounds normal. No  respiratory distress. He has no wheezes. He exhibits no tenderness.  Abdominal: Soft. Normal aorta and bowel sounds are normal. There is no tenderness. There is no rebound and no guarding.  Musculoskeletal: He exhibits no edema.  Neurological: He is alert.  Skin: Skin is warm and dry. He is not diaphoretic. No cyanosis. No pallor.  Psychiatric: He has a normal mood and affect.    ED Course  Procedures (including critical care time)  Labs Reviewed  COMPREHENSIVE METABOLIC PANEL - Abnormal; Notable for the following:    Sodium 134 (*)    Glucose, Bld 108 (*)    Creatinine, Ser 1.38 (*)    GFR calc non Af Amer 53 (*)    GFR calc Af Amer 61 (*)    All other components within normal limits  CBC  POCT I-STAT TROPONIN I  POCT I-STAT TROPONIN I   Dg Chest 2 View  11/08/2012   *RADIOLOGY REPORT*  Clinical Data: Chest pain this morning.  History of heart disease and hypertension.  CHEST - 2 VIEW  Comparison: 07/09/2007  Findings: The heart is normal in size and configuration.  There are small left coronary artery stents.  No mediastinal or hilar masses. The lungs are clear.  No pleural effusion or pneumothorax.  IMPRESSION: No acute cardiopulmonary disease.   Original Report Authenticated By: Amie Portland, M.D.     1. Chest pain, atypical     11:13 AM Patient seen and examined. Work-up initiated. Medications ordered. EKG reviewed.   Vital signs reviewed and are as follows: Filed Vitals:   11/08/12 1100  BP: 135/83  Pulse: 68  Temp: 98.1 F (36.7 C)  Resp: 18    Date: 11/08/2012  Rate: 67  Rhythm: normal sinus rhythm  QRS Axis: left  Intervals: normal  ST/T Wave abnormalities: normal  Conduction Disutrbances:none  Narrative Interpretation:   Old EKG Reviewed: none available  1:14 PM Patient d/w Dr. Oletta Lamas. On re-exam pain is 2/10. He did not receive NTG earlier. I have asked nurse to give this. Will treat pain and obtain 3 hr marker. Pt informed. He is comfortable.   4:56 PM  Second troponin neg. Pain controlled with toradol and GI cocktail. Pt informed of results. I have spoken with Dr. Sharyn Lull who agrees with d/c to home, f/u with Dr. Algie Coffer on Monday (2 days).   Pt informed and is in agreement with plan. He requests pain medication for home. States he had oxycodone for similar pain in past.   Patient counseled on use of narcotic pain medications. Counseled not to combine these medications with others containing tylenol. Urged not to drink alcohol, drive, or perform any other activities that requires focus while taking these medications. The patient verbalizes understanding and agrees with the plan.  Patient was counseled to return with severe chest pain, especially if the  pain is crushing or pressure-like and spreads to the arms, back, neck, or jaw, or if they have sweating, nausea, or shortness of breath with the pain. They were encouraged to call 911 with these symptoms.   They were also told to return if their chest pain gets worse and does not go away with rest, they have an attack of chest pain lasting longer than usual despite rest and treatment with the medications their caregiver has prescribed, if they wake from sleep with chest pain or shortness of breath, if they feel dizzy or faint, if they have chest pain not typical of their usual pain, or if they have any other emergent concerns regarding their health.  The patient verbalized understanding and agreed.      MDM  Patient with known CAD with atypical CP, non-ischemic EKG, 2 sets negative cardiac markers. Work-up is unremarkable. Pain is somewhat positional in nature, better with shrugging. Well controlled with toradol and GI cocktail. Patient had neg myoview 2009 and neg treadmill stress test last November. He has reliable cardiology follow-up. Feel patient is low risk for unstable angina, cardiac etiology for pain.        Renne Crigler, PA-C 11/08/12 1700

## 2013-02-04 ENCOUNTER — Observation Stay (HOSPITAL_COMMUNITY)
Admission: EM | Admit: 2013-02-04 | Discharge: 2013-02-05 | Disposition: A | Discharge status: Home / Self Care | Payer: MEDICAID | Attending: Cardiovascular Disease | Admitting: Cardiovascular Disease

## 2013-02-04 ENCOUNTER — Encounter (HOSPITAL_COMMUNITY)
Admission: EM | Disposition: A | Discharge status: Home / Self Care | Payer: Self-pay | Source: Home / Self Care | Attending: Cardiovascular Disease

## 2013-02-04 ENCOUNTER — Emergency Department (HOSPITAL_COMMUNITY): Payer: Self-pay

## 2013-02-04 ENCOUNTER — Encounter (HOSPITAL_COMMUNITY): Payer: Self-pay | Admitting: Emergency Medicine

## 2013-02-04 DIAGNOSIS — Z9861 Coronary angioplasty status: Secondary | ICD-10-CM | POA: Insufficient documentation

## 2013-02-04 DIAGNOSIS — Y831 Surgical operation with implant of artificial internal device as the cause of abnormal reaction of the patient, or of later complication, without mention of misadventure at the time of the procedure: Secondary | ICD-10-CM | POA: Insufficient documentation

## 2013-02-04 DIAGNOSIS — R079 Chest pain, unspecified: Secondary | ICD-10-CM

## 2013-02-04 DIAGNOSIS — Z79899 Other long term (current) drug therapy: Secondary | ICD-10-CM | POA: Insufficient documentation

## 2013-02-04 DIAGNOSIS — E7800 Pure hypercholesterolemia, unspecified: Secondary | ICD-10-CM | POA: Insufficient documentation

## 2013-02-04 DIAGNOSIS — T82897A Other specified complication of cardiac prosthetic devices, implants and grafts, initial encounter: Principal | ICD-10-CM | POA: Insufficient documentation

## 2013-02-04 DIAGNOSIS — Z7982 Long term (current) use of aspirin: Secondary | ICD-10-CM | POA: Insufficient documentation

## 2013-02-04 DIAGNOSIS — I200 Unstable angina: Secondary | ICD-10-CM | POA: Insufficient documentation

## 2013-02-04 DIAGNOSIS — I10 Essential (primary) hypertension: Secondary | ICD-10-CM | POA: Insufficient documentation

## 2013-02-04 DIAGNOSIS — I2510 Atherosclerotic heart disease of native coronary artery without angina pectoris: Secondary | ICD-10-CM | POA: Insufficient documentation

## 2013-02-04 HISTORY — DX: Pure hypercholesterolemia, unspecified: E78.00

## 2013-02-04 HISTORY — PX: PERCUTANEOUS CORONARY INTERVENTION-BALLOON ONLY: SHX6014

## 2013-02-04 HISTORY — DX: Gout, unspecified: M10.9

## 2013-02-04 HISTORY — PX: LEFT HEART CATHETERIZATION WITH CORONARY ANGIOGRAM: SHX5451

## 2013-02-04 HISTORY — DX: Angina pectoris, unspecified: I20.9

## 2013-02-04 LAB — BASIC METABOLIC PANEL
CO2: 23 mEq/L (ref 19–32)
Calcium: 9.5 mg/dL (ref 8.4–10.5)
Chloride: 104 mEq/L (ref 96–112)
Glucose, Bld: 98 mg/dL (ref 70–99)
Sodium: 137 mEq/L (ref 135–145)

## 2013-02-04 LAB — CBC WITH DIFFERENTIAL/PLATELET
Eosinophils Relative: 2 % (ref 0–5)
HCT: 40.4 % (ref 39.0–52.0)
Lymphocytes Relative: 32 % (ref 12–46)
Lymphs Abs: 2.4 10*3/uL (ref 0.7–4.0)
MCV: 89.4 fL (ref 78.0–100.0)
Platelets: 294 10*3/uL (ref 150–400)
RBC: 4.52 MIL/uL (ref 4.22–5.81)
WBC: 7.7 10*3/uL (ref 4.0–10.5)

## 2013-02-04 LAB — PROTIME-INR
INR: 0.92 (ref 0.00–1.49)
Prothrombin Time: 12.2 seconds (ref 11.6–15.2)

## 2013-02-04 LAB — POCT I-STAT TROPONIN I

## 2013-02-04 SURGERY — LEFT HEART CATHETERIZATION WITH CORONARY ANGIOGRAM
Anesthesia: LOCAL

## 2013-02-04 MED ORDER — TICAGRELOR 90 MG PO TABS
ORAL_TABLET | ORAL | Status: AC
  Filled 2013-02-04: qty 2

## 2013-02-04 MED ORDER — SIMVASTATIN 10 MG PO TABS
10.0000 mg | ORAL_TABLET | Freq: Every day | ORAL | Status: DC
  Filled 2013-02-04: qty 1

## 2013-02-04 MED ORDER — NITROGLYCERIN IN D5W 200-5 MCG/ML-% IV SOLN: 5.0000 ug/min | INTRAVENOUS | Status: DC

## 2013-02-04 MED ORDER — ACETAMINOPHEN 325 MG PO TABS: 650.0000 mg | ORAL_TABLET | ORAL | Status: DC | PRN

## 2013-02-04 MED ORDER — NITROGLYCERIN IN D5W 200-5 MCG/ML-% IV SOLN
INTRAVENOUS | Status: AC
  Filled 2013-02-04: qty 250

## 2013-02-04 MED ORDER — TICAGRELOR 90 MG PO TABS
90.0000 mg | ORAL_TABLET | Freq: Two times a day (BID) | ORAL | Status: DC
  Administered 2013-02-04 – 2013-02-05 (×2): 90 mg via ORAL
  Filled 2013-02-04 (×3): qty 1

## 2013-02-04 MED ORDER — ASPIRIN EC 81 MG PO TBEC
81.0000 mg | DELAYED_RELEASE_TABLET | Freq: Every day | ORAL | Status: DC
  Administered 2013-02-05: 11:00:00 81 mg via ORAL
  Filled 2013-02-04: qty 1

## 2013-02-04 MED ORDER — HEPARIN (PORCINE) IN NACL 2-0.9 UNIT/ML-% IJ SOLN
INTRAMUSCULAR | Status: AC
  Filled 2013-02-04: qty 1000

## 2013-02-04 MED ORDER — BIVALIRUDIN 250 MG IV SOLR
INTRAVENOUS | Status: AC
  Filled 2013-02-04: qty 250

## 2013-02-04 MED ORDER — MIDAZOLAM HCL 2 MG/2ML IJ SOLN
INTRAMUSCULAR | Status: AC
  Filled 2013-02-04: qty 2

## 2013-02-04 MED ORDER — NITROGLYCERIN 0.2 MG/ML ON CALL CATH LAB
INTRAVENOUS | Status: AC
  Filled 2013-02-04: qty 1

## 2013-02-04 MED ORDER — LIDOCAINE HCL (PF) 1 % IJ SOLN
INTRAMUSCULAR | Status: AC
  Filled 2013-02-04: qty 30

## 2013-02-04 MED ORDER — ONDANSETRON HCL 4 MG/2ML IJ SOLN: 4.0000 mg | Freq: Four times a day (QID) | INTRAMUSCULAR | Status: DC | PRN

## 2013-02-04 MED ORDER — NITROGLYCERIN 0.4 MG SL SUBL
0.4000 mg | SUBLINGUAL_TABLET | SUBLINGUAL | Status: DC | PRN
  Administered 2013-02-04: 0.4 mg via SUBLINGUAL
  Filled 2013-02-04: qty 25

## 2013-02-04 MED ORDER — SODIUM CHLORIDE 0.9 % IV SOLN: 0.2500 mg/kg/h | INTRAVENOUS | Status: AC

## 2013-02-04 MED ORDER — FENTANYL CITRATE 0.05 MG/ML IJ SOLN
INTRAMUSCULAR | Status: AC
  Filled 2013-02-04: qty 2

## 2013-02-04 MED ORDER — ASPIRIN 81 MG PO CHEW: 81.0000 mg | CHEWABLE_TABLET | Freq: Every day | ORAL | Status: DC

## 2013-02-04 MED ORDER — ISOSORBIDE MONONITRATE ER 60 MG PO TB24
60.0000 mg | ORAL_TABLET | Freq: Two times a day (BID) | ORAL | Status: DC
  Filled 2013-02-04: qty 1

## 2013-02-04 MED ORDER — LISINOPRIL 10 MG PO TABS
10.0000 mg | ORAL_TABLET | Freq: Two times a day (BID) | ORAL | Status: DC
Start: 2013-02-04 — End: 2013-02-05
  Administered 2013-02-04 – 2013-02-05 (×2): 10 mg via ORAL
  Filled 2013-02-04 (×3): qty 1

## 2013-02-04 MED ORDER — METOPROLOL TARTRATE 12.5 MG HALF TABLET
12.5000 mg | ORAL_TABLET | Freq: Two times a day (BID) | ORAL | Status: DC
  Administered 2013-02-04 – 2013-02-05 (×2): 12.5 mg via ORAL
  Filled 2013-02-04 (×3): qty 1

## 2013-02-04 MED ORDER — ASPIRIN 81 MG PO CHEW: 324.0000 mg | CHEWABLE_TABLET | ORAL | Status: DC

## 2013-02-04 MED ORDER — MORPHINE SULFATE 4 MG/ML IJ SOLN
4.0000 mg | Freq: Once | INTRAMUSCULAR | Status: AC
  Administered 2013-02-04: 4 mg via INTRAVENOUS
  Filled 2013-02-04 (×2): qty 1

## 2013-02-04 MED ORDER — ASPIRIN 300 MG RE SUPP
300.0000 mg | RECTAL | Status: DC
  Filled 2013-02-04: qty 1

## 2013-02-04 MED ORDER — SODIUM CHLORIDE 0.9 % IV SOLN: INTRAVENOUS | Status: AC

## 2013-02-04 NOTE — ED Notes (Signed)
Pt c/o left sided CP that is pressure type with SOB upon waking this am

## 2013-02-04 NOTE — ED Notes (Signed)
Reports awoke 0630 & noticed left side CP described as tightness. States pain unchanged since onset. Denies SOB, n/v, diaphoresis, fever, cold, cough or recent heavy lifting or pulling. Reports pain feels similar to past when he had has had stents placed, last '07. Last stress test 11 '13. States took ASA 325mg  at home PTA.

## 2013-02-04 NOTE — Progress Notes (Signed)
Verified heparin restart order as consult per pharmacy  with Dr Sharyn Lull, order discontinued, claimed med not needed. Fayrene Fearing pharmacist notified.

## 2013-02-04 NOTE — Progress Notes (Signed)
Site area: right groin  Site Prior to Removal:  Level 0  Pressure Applied For 30 MINUTES    Minutes Beginning at 1900  Manual:   yes  Patient Status During Pull:  AAO X 4  Post Pull Groin Site:  Level 0  Post Pull Instructions Given:  yes  Post Pull Pulses Present:  yes  Dressing Applied:  yes  Comments:  Tolerated procedure well

## 2013-02-04 NOTE — Progress Notes (Signed)
Patient agrees to cardiac cath. Discussed risk, benefits and alternatives prior to sedation and patient wants me to proceed with cardiac cath and possible angioplasty.

## 2013-02-04 NOTE — ED Notes (Signed)
Dr Kadakia at bedside 

## 2013-02-04 NOTE — ED Provider Notes (Signed)
CSN: [id number]     Arrival date & time 02/04/13  0747 History   First MD Initiated Contact with Patient 02/04/13 [phone]     Chief Complaint  Patient presents with  . Chest Pain   (Consider location/radiation/quality/duration/timing/severity/associated sxs/prior Treatment) HPI Comments: Patient with hypertension and known CAD, presents to the emergency department with chief complaint of chest pain that started this morning around 6:30. He describes the pain as a pressure to the left side of his chest. He states that he had mild shortness of breath when he awoke, but no diaphoresis. He denies any shortness of breath now, but states that the chest pressure has persisted. He is tried taking aspirin with no relief. He states the pressure is moderate, and feels like soreness sticking him with a dull pencil in the ribs. He is followed by Dr. Algie Coffer.  He has had 3 stents, the last of which was 7 years ago.  The history is provided by the patient. No language interpreter was used.    Past Medical History  Diagnosis Date  . Hypertension   . CAD (coronary artery disease)    History reviewed. No pertinent past surgical history. History reviewed. No pertinent family history. History  Substance Use Topics  . Smoking status: Never Smoker   . Smokeless tobacco: Not on file  . Alcohol Use: Yes     Comment: occ    Review of Systems  All other systems reviewed and are negative.    Allergies  Review of patient's allergies indicates no known allergies.  Home Medications   Current Outpatient Rx  Name  Route  Sig  Dispense  Refill  . aspirin EC 325 MG tablet   Oral   Take 325 mg by mouth daily.         Marland Kitchen HYDROcodone-acetaminophen (NORCO/VICODIN) 5-325 MG per tablet      Take 1-2 tablets every 6 hours as needed for severe pain   8 tablet   0   . isosorbide mononitrate (IMDUR) 60 MG 24 hr tablet   Oral   Take 60 mg by mouth 2 (two) times daily.         Marland Kitchen lisinopril (PRINIVIL,ZESTRIL)  10 MG tablet   Oral   Take 10 mg by mouth 2 (two) times daily.         . metoprolol tartrate (LOPRESSOR) 25 MG tablet   Oral   Take 12.5 mg by mouth 2 (two) times daily.         . pravastatin (PRAVACHOL) 40 MG tablet   Oral   Take 40 mg by mouth 2 (two) times daily.          BP 139/86  Pulse 63  Temp(Src) 98.2 F (36.8 C) (Oral)  Resp 18  SpO2 98% Physical Exam  Nursing note and vitals reviewed. Constitutional: He is oriented to person, place, and time. He appears well-developed and well-nourished.  HENT:  Head: Normocephalic and atraumatic.  Right Ear: External ear normal.  Left Ear: External ear normal.  Nose: Nose normal.  Mouth/Throat: Oropharynx is clear and moist. No oropharyngeal exudate.  Eyes: Conjunctivae and EOM are normal. Pupils are equal, round, and reactive to light. Right eye exhibits no discharge. Left eye exhibits no discharge. No scleral icterus.  Neck: Normal range of motion. Neck supple. No JVD present.  Cardiovascular: Normal rate, regular rhythm, normal heart sounds and intact distal pulses.  Exam reveals no gallop and no friction rub.   No murmur heard. Pulmonary/Chest: Effort  normal and breath sounds normal. No respiratory distress. He has no wheezes. He has no rales. He exhibits no tenderness.  Abdominal: Soft. Bowel sounds are normal. He exhibits no distension and no mass. There is no tenderness. There is no rebound and no guarding.  Musculoskeletal: Normal range of motion. He exhibits no edema and no tenderness.  Neurological: He is alert and oriented to person, place, and time. He has normal reflexes.  CN 3-12 intact  Skin: Skin is warm and dry.  Psychiatric: He has a normal mood and affect. His behavior is normal. Judgment and thought content normal.    ED Course  Procedures (including critical care time) Labs Review Results for orders placed during the hospital encounter of 02/04/13  CBC WITH DIFFERENTIAL      Result Value Range    WBC 7.7  4.0 - 10.5 K/uL   RBC 4.52  4.22 - 5.81 MIL/uL   Hemoglobin 13.9  13.0 - 17.0 g/dL   HCT 16.1  09.6 - 04.5 %   MCV 89.4  78.0 - 100.0 fL   MCH 30.8  26.0 - 34.0 pg   MCHC 34.4  30.0 - 36.0 g/dL   RDW 40.9  81.1 - 91.4 %   Platelets 294  150 - 400 K/uL   Neutrophils Relative % 56  43 - 77 %   Neutro Abs 4.3  1.7 - 7.7 K/uL   Lymphocytes Relative 32  12 - 46 %   Lymphs Abs 2.4  0.7 - 4.0 K/uL   Monocytes Relative 10  3 - 12 %   Monocytes Absolute 0.8  0.1 - 1.0 K/uL   Eosinophils Relative 2  0 - 5 %   Eosinophils Absolute 0.2  0.0 - 0.7 K/uL   Basophils Relative 1  0 - 1 %   Basophils Absolute 0.0  0.0 - 0.1 K/uL  BASIC METABOLIC PANEL      Result Value Range   Sodium 137  135 - 145 mEq/L   Potassium 4.1  3.5 - 5.1 mEq/L   Chloride 104  96 - 112 mEq/L   CO2 23  19 - 32 mEq/L   Glucose, Bld 98  70 - 99 mg/dL   BUN 17  6 - 23 mg/dL   Creatinine, Ser 7.82 (*) 0.50 - 1.35 mg/dL   Calcium 9.5  8.4 - 95.6 mg/dL   GFR calc non Af Amer 53 (*) >90 mL/min   GFR calc Af Amer 62 (*) >90 mL/min  POCT I-STAT TROPONIN I      Result Value Range   Troponin i, poc 0.00  0.00 - 0.08 ng/mL   Comment 3            Dg Chest Portable 1 View  02/04/2013   *RADIOLOGY REPORT*  Clinical Data: Chest pain.  PORTABLE CHEST - 1 VIEW  Comparison: PA and lateral chest 11/08/2012.  Findings: The lungs are clear.  Heart size is normal.  No pneumothorax or pleural fluid.  IMPRESSION: Negative chest.   Original Report Authenticated By: Holley Dexter, M.D.    Imaging Review Dg Chest Portable 1 View  02/04/2013   *RADIOLOGY REPORT*  Clinical Data: Chest pain.  PORTABLE CHEST - 1 VIEW  Comparison: PA and lateral chest 11/08/2012.  Findings: The lungs are clear.  Heart size is normal.  No pneumothorax or pleural fluid.  IMPRESSION: Negative chest.   Original Report Authenticated By: Holley Dexter, M.D.    MDM   1. Chest pain  Patient with known CAD, and hypertension, with chest pain since this  morning. No exertional component. Check basic labs, EKG, and chest x-ray. Anticipate consultation with Dr. Algie Coffer.   11:12 AM Dr. Algie Coffer has seen and admitted the patient.   Roxy Horseman, PA-C 02/04/13 1113

## 2013-02-04 NOTE — CV Procedure (Signed)
PROCEDURE:  Left heart catheterization with selective coronary angiography, left ventriculogram.  CLINICAL HISTORY:  This is a 62 years old male with known CAD and LAD, diagonal stents has recurrent chest pain.  The risks, benefits, and details of the procedure were explained to the patient.  The patient verbalized understanding and wanted to proceed.  Informed written consent was obtained.  PROCEDURE TECHNIQUE:  The patient was approached from the right femoral artery using a 5 French short sheath.  Left coronary angiography was done using a Judkins L4 guide catheter.  Right coronary angiography was done using a Judkins R4 guide catheter.  Left ventriculography was done using a pigtail catheter.    CONTRAST:  Total of 40 cc.  COMPLICATIONS:  None.  At the end of the procedure a manual device will be used for hemostasis.    HEMODYNAMICS:  Aortic pressure was 142/82; LV pressure was 140/6 17; LVEDP 17.  There was no gradient between the left ventricle and aorta.    ANGIOGRAM/CORONARY ARTERIOGRAM:   The left main coronary artery is unremarkable.  The left anterior descending artery has patent proximal stent. Instent 80 % stenosis in diagonal with good distal flow.  The left circumflex artery is unremarkable.  The right coronary artery is dominant and without significant disease.  LEFT VENTRICULOGRAM:  Left ventricular angiogram was done in the 30 RAO projection and revealed normal left ventricular wall motion and systolic function with an estimated ejection fraction of 60%.  LVEDP was 17 mmHg.  IMPRESSION OF HEART CATHETERIZATION:   1. Normal left main coronary artery. 2. Patent proximal stent in left anterior descending artery and severe in stent disease of diagonal vessel. 3. Normal left circumflex artery and its branches. 4. Normal right coronary artery. 5. Normal left ventricular systolic function.  LVEDP 17 mmHg.  Ejection fraction 60%.  RECOMMENDATION:   PTCA with cutting balloon  of diagonal stenosis by Dr. Sharyn Lull.

## 2013-02-04 NOTE — CV Procedure (Signed)
PTCA report dictated on 02/04/2013 dictation number is [phone]

## 2013-02-04 NOTE — ED Provider Notes (Signed)
Medical screening examination/treatment/procedure(s) were conducted as a shared visit with non-physician practitioner(s) and myself.  I personally evaluated the patient during the encounter Patient with history of coronary artery disease presents with chest pain starting this morning. It is now resolved after pain and nitroglycerin. Initial troponin and EKG are normal. Cardiology to see in the ED  Loren Racer, MD 02/05/13 [phone]

## 2013-02-04 NOTE — H&P (Signed)
ADRIN [name] is an 62 y.o. male.   Chief Complaint: Chest pain HPI: 62 years old male with recurrent chest pain described as tightness without nausea or sweating spell. No fever or cough. Also had dizziness few days ago. H/O CAD with LAD and diagonal stent.  Past Medical History  Diagnosis Date  . Hypertension   . CAD (coronary artery disease)       History reviewed. No pertinent past surgical history.  History reviewed. No pertinent family history. Social History:  reports that he has never smoked. He does not have any smokeless tobacco history on file. He reports that  drinks alcohol. He reports that he does not use illicit drugs.  Allergies: No Known Allergies   (Not in a hospital admission)  Results for orders placed during the hospital encounter of 02/04/13 (from the past 48 hour(s))  CBC WITH DIFFERENTIAL     Status: None   Collection Time    02/04/13  8:14 AM      Result Value Range   WBC 7.7  4.0 - 10.5 K/uL   RBC 4.52  4.22 - 5.81 MIL/uL   Hemoglobin 13.9  13.0 - 17.0 g/dL   HCT 40.9  81.1 - 91.4 %   MCV 89.4  78.0 - 100.0 fL   MCH 30.8  26.0 - 34.0 pg   MCHC 34.4  30.0 - 36.0 g/dL   RDW 78.2  95.6 - 21.3 %   Platelets 294  150 - 400 K/uL   Neutrophils Relative % 56  43 - 77 %   Neutro Abs 4.3  1.7 - 7.7 K/uL   Lymphocytes Relative 32  12 - 46 %   Lymphs Abs 2.4  0.7 - 4.0 K/uL   Monocytes Relative 10  3 - 12 %   Monocytes Absolute 0.8  0.1 - 1.0 K/uL   Eosinophils Relative 2  0 - 5 %   Eosinophils Absolute 0.2  0.0 - 0.7 K/uL   Basophils Relative 1  0 - 1 %   Basophils Absolute 0.0  0.0 - 0.1 K/uL  BASIC METABOLIC PANEL     Status: Abnormal   Collection Time    02/04/13  8:14 AM      Result Value Range   Sodium 137  135 - 145 mEq/L   Potassium 4.1  3.5 - 5.1 mEq/L   Chloride 104  96 - 112 mEq/L   CO2 23  19 - 32 mEq/L   Glucose, Bld 98  70 - 99 mg/dL   BUN 17  6 - 23 mg/dL   Creatinine, Ser 0.86 (*) 0.50 - 1.35 mg/dL   Calcium 9.5  8.4 - 57.8  mg/dL   GFR calc non Af Amer 53 (*) >90 mL/min   GFR calc Af Amer 62 (*) >90 mL/min   Comment: (NOTE)     The eGFR has been calculated using the CKD EPI equation.     This calculation has not been validated in all clinical situations.     eGFR's persistently <90 mL/min signify possible Chronic Kidney     Disease.  POCT I-STAT TROPONIN I     Status: None   Collection Time    02/04/13  8:20 AM      Result Value Range   Troponin i, poc 0.00  0.00 - 0.08 ng/mL   Comment 3            Comment: Due to the release kinetics of cTnI,  a negative result within the first hours     of the onset of symptoms does not rule out     myocardial infarction with certainty.     If myocardial infarction is still suspected,     repeat the test at appropriate intervals.   Dg Chest Portable 1 View  02/04/2013   *RADIOLOGY REPORT*  Clinical Data: Chest pain.  PORTABLE CHEST - 1 VIEW  Comparison: PA and lateral chest 11/08/2012.  Findings: The lungs are clear.  Heart size is normal.  No pneumothorax or pleural fluid.  IMPRESSION: Negative chest.   Original Report Authenticated By: Holley Dexter, M.D.    @ROS @ Denies recent weight gain or weight loss. Wears glasses. Has a left eye injury 25 years ago. Has recurrent chest pain. No history of asthma, COPD, palpitations, leg swelling, GI bleed, hepatitis, blood transfusion, kidney stone, stroke, seizures or psychiatric admissions.   Blood pressure 133/81, pulse 66, temperature 98.2 F (36.8 C), temperature source Oral, resp. rate 18, SpO2 97.00%.  GENERAL: Patient is alert and oriented x3.  HEENT: Patient is normocephalic, atraumatic. Pupils equal and reactive  to light. Extraocular movement intact. Conjunctivae are pink. Sclerae  are nonicteric. He has unequal pupils, but reacting to light.  NECK: No JVD. No carotid bruit. No thyromegaly. Full range of motion  of the neck.  LUNGS: Clear bilaterally.  HEART: Normal S1, S2 with a grade 1/6 systolic  murmur.  ABDOMEN: Soft and nontender.  EXTREMITIES: No edema, cyanosis or clubbing.  CNS: Cranial nerves grossly intact. Patient moves all 4 extremities.  Assessment/Plan Unstable angina CAD S/P stent in LAD/Diagonal  Place in observation/c.cath today.  Shaniquia Brafford S 02/04/2013, 11:02 AM

## 2013-02-05 LAB — CBC
MCHC: 34.8 g/dL (ref 30.0–36.0)
Platelets: 316 10*3/uL (ref 150–400)
RDW: 13.6 % (ref 11.5–15.5)

## 2013-02-05 LAB — BASIC METABOLIC PANEL
BUN: 13 mg/dL (ref 6–23)
Creatinine, Ser: 1.2 mg/dL (ref 0.50–1.35)
GFR calc Af Amer: 73 mL/min — ABNORMAL LOW (ref >90)
GFR calc non Af Amer: 63 mL/min — ABNORMAL LOW (ref >90)
Potassium: 4.3 mEq/L (ref 3.5–5.1)

## 2013-02-05 LAB — LIPID PANEL
Cholesterol: 203 mg/dL — ABNORMAL HIGH (ref 0–200)
Triglycerides: 143 mg/dL (ref <150)

## 2013-02-05 LAB — TROPONIN I: Troponin I: <0.3 ng/mL (ref <0.30)

## 2013-02-05 MED ORDER — TICAGRELOR 90 MG PO TABS: 90.0000 mg | ORAL_TABLET | Freq: Two times a day (BID) | ORAL | Status: DC

## 2013-02-05 MED ORDER — ASPIRIN 81 MG PO TBEC: 81.0000 mg | DELAYED_RELEASE_TABLET | Freq: Every day | ORAL | Status: DC

## 2013-02-05 MED FILL — Sodium Chloride IV Soln 0.9%: INTRAVENOUS | Qty: 50 | Status: AC

## 2013-02-05 NOTE — Progress Notes (Signed)
CARDIAC REHAB PHASE I   PRE:  Rate/Rhythm: 64 SR    BP: sitting 149/76    SaO2:   MODE:  Ambulation: 500 ft   POST:  Rate/Rhythm: 88 SR    BP: sitting 154/84     SaO2:   Tolerated well. Slight intermittent SOB, which pt denies. He sts he feels good. Ed completed. Wants to discuss CRPII with MD. [employer] brochure. 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 [phone]   Elissa Lovett [place]brook CES, ACSM 02/05/2013 8:27 AM

## 2013-02-05 NOTE — Discharge Summary (Signed)
Physician Discharge Summary  Patient ID: [name] MRN: [id number] DOB/AGE: 11/22/1950 62 y.o.  Admit date: 02/04/2013 Discharge date: 02/05/2013  Admission Diagnoses: Unstable angina  CAD  S/P stent in LAD/Diagonal  Discharge Diagnoses:  Principle Problem: * Unstable angina * CAD  S/P stent in LAD/Diagonal Hypercholesterolemia  Discharged Condition: fair  Hospital Course: 62 years old male with recurrent chest pain described as tightness without nausea or sweating spell but occasional dizziness. He underwent cardiac catheterization showing significant in-stent diagonal vessel stenosis of 80 %. He underwent cutting balloon and non-compliant balloon angioplasty with several inflations with decreasing lesion to about 50 % with good distal flow. He was discharged home with aspirin and Brilinta.   Consults: cardiology  Significant Diagnostic Studies: labs: Normal CBC and BMET. Elevated LDL cholesterol of 132 mg/dl  EKG-NSR, Low voltage.  Cardiac cath showing patent LAD stent and 80 % in-stent diagonal vessel disease.  Treatments: cardiac meds: lisinopril (Prinivil), metoprolol, Pravachol and Brilinta.  Discharge Exam: Blood pressure 145/92, pulse 74, temperature 98.4 F (36.9 C), temperature source Oral, resp. rate 18, weight 78.3 kg (172 lb 9.9 oz), SpO2 98.00%. GENERAL: Patient is alert and oriented x3.  HEENT: Patient is normocephalic, atraumatic. Pupils equal and reactive  to light. Extraocular movement intact. Conjunctivae are pink. Sclerae  are nonicteric. He has unequal pupils, but reacting to light.  NECK: No JVD. No carotid bruit. No thyromegaly. Full range of motion  of the neck.  LUNGS: Clear bilaterally.  HEART: Normal S1, S2 with a grade 1/6 systolic murmur.  ABDOMEN: Soft and nontender.  EXTREMITIES: No edema, cyanosis or clubbing.  CNS: Cranial nerves grossly intact. Patient moves all 4 extremities.   Disposition: 01-Home or Self Care      Medication List         aspirin 81 MG EC tablet  Take 1 tablet (81 mg total) by mouth daily.     isosorbide mononitrate 60 MG 24 hr tablet  Commonly known as:  IMDUR  Take 60 mg by mouth 2 (two) times daily.     lisinopril 10 MG tablet  Commonly known as:  PRINIVIL,ZESTRIL  Take 10 mg by mouth 2 (two) times daily.     metoprolol tartrate 25 MG tablet  Commonly known as:  LOPRESSOR  Take 12.5 mg by mouth 2 (two) times daily.     pravastatin 40 MG tablet  Commonly known as:  PRAVACHOL  Take 40 mg by mouth 2 (two) times daily.     Ticagrelor 90 MG Tabs tablet  Commonly known as:  BRILINTA  Take 1 tablet (90 mg total) by mouth 2 (two) times daily.         SignedOrpah Cobb S 02/05/2013, 9:49 AM

## 2013-02-05 NOTE — Progress Notes (Signed)
Utilization Review Completed Xylia Scherger J. Shacola Schussler, RN, BSN, NCM [phone]  

## 2013-02-05 NOTE — Cardiovascular Report (Signed)
NAMENOLE, ROBEY             ACCOUNT NO.:  [id number]  MEDICAL RECORD NO.:  [id number]  LOCATION:  6C07C                        FACILITY:  [facility]  PHYSICIAN:  Mykaylah Ballman N. Sharyn Lull, M.D. DATE OF BIRTH:  1950/11/12  DATE OF PROCEDURE:  02/04/2013 DATE OF DISCHARGE:                           CARDIAC CATHETERIZATION   PROCEDURE:  Successful PTCA to ostial diagonal 1 using initially AngioSculpt 3.0 x 6-mm long balloon followed by 2.75 x 8-mm long [place] Quantum apex and 3.0 x 8-mm long [place] Quantum apex balloon.  INDICATION FOR THE PROCEDURE:  Mr. Chaikin is a 62 year old male with past medical history significant for coronary artery disease, history of PTCA and stenting to mid LAD and ostial circumflex in the past. Subsequently had restenosis in ostial diagonal 1, requiring PTCA with cutting balloon in 2007, was admitted by Dr. Algie Coffer because of recurrent chest pain, typical of angina.  MI was ruled out.  The patient subsequently underwent cardiac catheterization by Dr. Algie Coffer, which showed LV showed good LV systolic function, left main was patent, and mid LAD has mid 20-30% stenosis at the distal edge of the stent. Diagonal 1 has 80-85% ostial stenosis and left circumflex was patent. OM 1 was patent.  RCA was patent.  I was called for PCI to ostial diagonal 1.  Discussed briefly with the patient regarding PCI to ostial diagonal 1, its risks and benefits and consented for the procedure.  PROCEDURE:  After obtaining the informed consent, a 6-French XB LAD 3.5 guiding catheter was advanced over the wire under fluoroscopic guidance up to the ascending aorta.  Wire was pulled out, the catheter was aspirated and connected to the Manifold.  Catheter was further advanced and engaged into left coronary ostium.  A single view of right coronary artery was obtained.  The findings were as above.  INTERVENTIONAL PROCEDURE:  Successful PTCA to ostial diagonal 1 was done using 3.0 x 6-mm long  AngioSculpt PTCA balloon, multiple inflations were done from 4 to 13 atmospheric pressure.  Angiogram showed persistent 50- 60% stenosis at the ostium despite multiple inflations and then [place] Quantum apex balloon was used, 2.75 x 8-mm going up to 20 atmospheric pressure and then 3.0 x 8-mm long Quantum apex balloon going again up to 20 atmospheric pressure, lesion dilated from 80-85% to less than 30% residual.  The patient received weight-based Angiomax, 180 mg of Brilinta prior to the procedure.  The patient tolerated the procedure well.  There were no complications.  The patient was transferred to recovery room in stable condition.     Eduardo Osier. Sharyn Lull, M.D.     M[facility]/MEDQ  D:  02/04/2013  T:  02/05/2013  Job:  [id number]

## 2013-02-05 NOTE — Care Management Note (Signed)
    Page 1 of 1   02/05/2013     10:43:42 AM   CARE MANAGEMENT NOTE 02/05/2013  Patient:  HRIDAY, STAI   Account Number:  [id number]  Date Initiated:  02/05/2013  Documentation initiated by:  [facility]  Subjective/Objective Assessment:   62 year-old white male presents to the emergency room today after developing chest pressure//HOME WITH SELF CARE     Action/Plan:   HEART CATH WITH STENT//BRILINTA MEDICATION CARD   Anticipated DC Date:  02/05/2013   Anticipated DC Plan:  HOME/SELF CARE      DC Planning Services  CM consult      Choice offered to / List presented to:             Status of service:   Medicare Important Message given?   (If response is "NO", the following Medicare IM given date fields will be blank) Date Medicare IM given:   Date Additional Medicare IM given:    Discharge Disposition:    Per UR Regulation:    If discussed at Long Length of Stay Meetings, dates discussed:    Comments:  02/05/13  [address], RN, BSN, [employer] [phone] Spoke with pt at bedside regarding medication assistance. Pt states he has no insurance but does not have problems paying for medications. Pt utilizes [facility] on [address] [place] for prescription needs.  NCM suggested switiching to [facility] or [facility] for better pricing; pt states he will consider.  Pt has Brilinta brochure with 30day free care and prescription refill card intact.

## 2013-02-10 ENCOUNTER — Encounter (HOSPITAL_COMMUNITY): Payer: Self-pay | Admitting: *Deleted

## 2013-02-10 ENCOUNTER — Emergency Department (HOSPITAL_COMMUNITY): Payer: Self-pay

## 2013-02-10 ENCOUNTER — Inpatient Hospital Stay (HOSPITAL_COMMUNITY)
Admission: EM | Admit: 2013-02-10 | Discharge: 2013-02-11 | DRG: 287 | Disposition: A | Discharge status: Home / Self Care | Payer: Self-pay | Attending: Cardiovascular Disease | Admitting: Cardiovascular Disease

## 2013-02-10 DIAGNOSIS — I10 Essential (primary) hypertension: Secondary | ICD-10-CM | POA: Diagnosis present

## 2013-02-10 DIAGNOSIS — F411 Generalized anxiety disorder: Secondary | ICD-10-CM | POA: Diagnosis present

## 2013-02-10 DIAGNOSIS — E7800 Pure hypercholesterolemia, unspecified: Secondary | ICD-10-CM | POA: Diagnosis present

## 2013-02-10 DIAGNOSIS — Z9861 Coronary angioplasty status: Secondary | ICD-10-CM

## 2013-02-10 DIAGNOSIS — I200 Unstable angina: Principal | ICD-10-CM | POA: Diagnosis present

## 2013-02-10 DIAGNOSIS — I2510 Atherosclerotic heart disease of native coronary artery without angina pectoris: Secondary | ICD-10-CM | POA: Diagnosis present

## 2013-02-10 DIAGNOSIS — Z7982 Long term (current) use of aspirin: Secondary | ICD-10-CM

## 2013-02-10 DIAGNOSIS — I208 Other forms of angina pectoris: Secondary | ICD-10-CM

## 2013-02-10 DIAGNOSIS — M109 Gout, unspecified: Secondary | ICD-10-CM | POA: Diagnosis present

## 2013-02-10 LAB — CBC WITH DIFFERENTIAL/PLATELET
Basophils Absolute: 0 10*3/uL (ref 0.0–0.1)
Basophils Relative: 1 % (ref 0–1)
Hemoglobin: 13.5 g/dL (ref 13.0–17.0)
Lymphocytes Relative: 36 % (ref 12–46)
MCH: 31.2 pg (ref 26.0–34.0)
MCV: 90.3 fL (ref 78.0–100.0)
Monocytes Absolute: 1 10*3/uL (ref 0.1–1.0)
Neutro Abs: 3.8 10*3/uL (ref 1.7–7.7)
RBC: 4.33 MIL/uL (ref 4.22–5.81)

## 2013-02-10 LAB — POCT I-STAT TROPONIN I: Troponin i, poc: 0 ng/mL (ref 0.00–0.08)

## 2013-02-10 LAB — HEPARIN LEVEL (UNFRACTIONATED)
Heparin Unfractionated: 0.57 IU/mL (ref 0.30–0.70)
Heparin Unfractionated: 0.64 IU/mL (ref 0.30–0.70)

## 2013-02-10 MED ORDER — ONDANSETRON HCL 4 MG/2ML IJ SOLN: 4.0000 mg | Freq: Four times a day (QID) | INTRAMUSCULAR | Status: DC | PRN

## 2013-02-10 MED ORDER — SODIUM CHLORIDE 0.9 % IJ SOLN: 3.0000 mL | INTRAMUSCULAR | Status: DC | PRN

## 2013-02-10 MED ORDER — ACETAMINOPHEN 325 MG PO TABS: 650.0000 mg | ORAL_TABLET | ORAL | Status: DC | PRN

## 2013-02-10 MED ORDER — TICAGRELOR 90 MG PO TABS
90.0000 mg | ORAL_TABLET | Freq: Two times a day (BID) | ORAL | Status: DC
  Administered 2013-02-10 – 2013-02-11 (×3): 90 mg via ORAL
  Filled 2013-02-10 (×4): qty 1

## 2013-02-10 MED ORDER — HEPARIN BOLUS VIA INFUSION
4000.0000 [IU] | Freq: Once | INTRAVENOUS | Status: AC
  Administered 2013-02-10: 4000 [IU] via INTRAVENOUS
  Filled 2013-02-10: qty 4000

## 2013-02-10 MED ORDER — TICAGRELOR 90 MG PO TABS: 90.0000 mg | ORAL_TABLET | Freq: Two times a day (BID) | ORAL | Status: DC

## 2013-02-10 MED ORDER — SIMVASTATIN 20 MG PO TABS
20.0000 mg | ORAL_TABLET | Freq: Every day | ORAL | Status: DC
  Administered 2013-02-10 – 2013-02-11 (×2): 20 mg via ORAL
  Filled 2013-02-10 (×2): qty 1

## 2013-02-10 MED ORDER — LISINOPRIL 10 MG PO TABS
10.0000 mg | ORAL_TABLET | Freq: Two times a day (BID) | ORAL | Status: DC
  Administered 2013-02-10 – 2013-02-11 (×3): 10 mg via ORAL
  Filled 2013-02-10 (×4): qty 1

## 2013-02-10 MED ORDER — NITROGLYCERIN 0.4 MG SL SUBL
0.4000 mg | SUBLINGUAL_TABLET | SUBLINGUAL | Status: DC | PRN
  Administered 2013-02-10: 0.4 mg via SUBLINGUAL
  Filled 2013-02-10: qty 25

## 2013-02-10 MED ORDER — NITROGLYCERIN IN D5W 200-5 MCG/ML-% IV SOLN
5.0000 ug/min | INTRAVENOUS | Status: DC
  Administered 2013-02-10: 5 ug/min via INTRAVENOUS
  Filled 2013-02-10: qty 1250

## 2013-02-10 MED ORDER — ASPIRIN EC 81 MG PO TBEC
81.0000 mg | DELAYED_RELEASE_TABLET | Freq: Every day | ORAL | Status: DC
  Administered 2013-02-11: 81 mg via ORAL
  Filled 2013-02-10: qty 1

## 2013-02-10 MED ORDER — METOPROLOL TARTRATE 12.5 MG HALF TABLET
12.5000 mg | ORAL_TABLET | Freq: Two times a day (BID) | ORAL | Status: DC
  Administered 2013-02-10 – 2013-02-11 (×3): 12.5 mg via ORAL
  Filled 2013-02-10 (×4): qty 1

## 2013-02-10 MED ORDER — AMLODIPINE BESYLATE 2.5 MG PO TABS
2.5000 mg | ORAL_TABLET | Freq: Every day | ORAL | Status: DC
  Administered 2013-02-10 – 2013-02-11 (×2): 2.5 mg via ORAL
  Filled 2013-02-10 (×2): qty 1

## 2013-02-10 MED ORDER — HEPARIN (PORCINE) IN NACL 100-0.45 UNIT/ML-% IJ SOLN
900.0000 [IU]/h | INTRAMUSCULAR | Status: DC
  Administered 2013-02-10 – 2013-02-11 (×2): 1000 [IU]/h via INTRAVENOUS
  Filled 2013-02-10 (×2): qty 250

## 2013-02-10 MED ORDER — SODIUM CHLORIDE 0.9 % IJ SOLN
3.0000 mL | Freq: Two times a day (BID) | INTRAMUSCULAR | Status: DC
  Administered 2013-02-10 (×2): 3 mL via INTRAVENOUS

## 2013-02-10 MED ORDER — ASPIRIN EC 81 MG PO TBEC: 81.0000 mg | DELAYED_RELEASE_TABLET | Freq: Every day | ORAL | Status: DC

## 2013-02-10 MED ORDER — SODIUM CHLORIDE 0.9 % IV SOLN: 250.0000 mL | INTRAVENOUS | Status: DC | PRN

## 2013-02-10 NOTE — ED Notes (Signed)
Pt to ED via [facility] for evaluation of central chest pain that started this evening- pt had cardiac cath completed last week.  EMS administered 3 Nitro and 324 ASA- pt denies CP upon arrival to ED, alert and oriented X 4.  NSR on monitor, IV in place.

## 2013-02-10 NOTE — Progress Notes (Signed)
ANTICOAGULATION CONSULT NOTE - Initial Consult  Pharmacy Consult for heparin Indication: chest pain/ACS  No Known Allergies  Patient Measurements: Height: 5\' 6"  (167.6 cm) Weight: 170 lb (77.111 kg) IBW/kg (Calculated) : 63.8  Vital Signs: Temp: 97.9 F (36.6 C) (09/02 0218) Temp src: Oral (09/02 0218) BP: 145/94 mmHg (09/02 0218)  Labs:  Recent Labs  02/10/13 0230  HGB 13.5  HCT 39.1  PLT 322    Estimated Creatinine Clearance: 61.6 ml/min (by C-G formula based on Cr of 1.2).   Medical History: Past Medical History  Diagnosis Date  . Hypertension   . CAD (coronary artery disease)   . High cholesterol   . Anginal pain   . Gout     Assessment: 62yo male had cardiac cath last week, now c/o central CP since last evening, relieved w/ NTG x3 by EMS, initial troponin negative, to begin heparin.  Goal of Therapy:  Heparin level 0.3-0.7 units/ml Monitor platelets by anticoagulation protocol: Yes   Plan:  Will give heparin 4000 units IV bolus x1 followed by gtt at 1000 units/hr and monitor heparin levels and CBC.  Vernard Gambles, PharmD, BCPS  02/10/2013,3:34 AM

## 2013-02-10 NOTE — Progress Notes (Signed)
ANTICOAGULATION CONSULT NOTE - Follow-Up Consult  Pharmacy Consult for heparin Indication: chest pain/ACS  No Known Allergies  Patient Measurements: Height: 5\' 6"  (167.6 cm) Weight: 166 lb 7.2 oz (75.5 kg) IBW/kg (Calculated) : 63.8  Vital Signs: Temp: 98.1 F (36.7 C) (09/02 1212) Temp src: Oral (09/02 1212) BP: 119/82 mmHg (09/02 1200) Pulse Rate: 71 (09/02 1200)  Labs:  Recent Labs  02/10/13 0230 02/10/13 1000 02/10/13 1100  HGB 13.5  --   --   HCT 39.1  --   --   PLT 322  --   --   HEPARINUNFRC  --   --  0.57  TROPONINI  --  <0.30  --     Estimated Creatinine Clearance: 56.9 ml/min (by C-G formula based on Cr of 1.2).   Medical History: Past Medical History  Diagnosis Date  . Hypertension   . CAD (coronary artery disease)   . High cholesterol   . Anginal pain   . Gout    . heparin 1,000 Units/hr (02/10/13 0800)  . nitroGLYCERIN 7 mcg/min (02/10/13 1231)   Assessment: 62yo male had cardiac cath last week, now c/o central CP since last evening, relieved w/ NTG x3 by EMS, initial troponin negative, heparin started early this AM at 1000 units/hr.  Initial heparin level therapeutic.  No bleeding or complications noted per chart notes.  Goal of Therapy:  Heparin level 0.3-0.7 units/ml Monitor platelets by anticoagulation protocol: Yes   Plan:  1. Continue IV heparin at current rate. 2. Confirm heparin level at 1800. 3. F/u plans for further testing. 4. Daily heparin level and CBC.  Tad Moore, BCPS  Clinical Pharmacist Pager [phone]  02/10/2013 1:12 PM

## 2013-02-10 NOTE — H&P (Signed)
[name] is an 62 y.o. male.   Chief Complaint: Chest pain HPI:  62 years old male with recent diagonal angioplasty has recurrent chest pain which is left sided, dull, non-radiating, mostly relieved by NTG and associated with diaphoresis and not with cough or shortness of breath. His LAD stent was patent.  Past Medical History  Diagnosis Date  . Hypertension   . CAD (coronary artery disease)   . High cholesterol   . Anginal pain   . Gout       Past Surgical History  Procedure Laterality Date  . Cardiac catheterization  2000; 2007    "1 + 2" (02/04/2013)  . Coronary angioplasty  02/04/2013    No family history on file. Social History:  reports that he has never smoked. He has never used smokeless tobacco. He reports that he drinks about 7.2 ounces of alcohol per week. He reports that he does not use illicit drugs.  Allergies: No Known Allergies   (Not in a hospital admission)  Results for orders placed during the hospital encounter of 02/10/13 (from the past 48 hour(s))  CBC WITH DIFFERENTIAL     Status: Abnormal   Collection Time    02/10/13  2:30 AM      Result Value Range   WBC 8.0  4.0 - 10.5 K/uL   RBC 4.33  4.22 - 5.81 MIL/uL   Hemoglobin 13.5  13.0 - 17.0 g/dL   HCT 16.1  09.6 - 04.5 %   MCV 90.3  78.0 - 100.0 fL   MCH 31.2  26.0 - 34.0 pg   MCHC 34.5  30.0 - 36.0 g/dL   RDW 40.9  81.1 - 91.4 %   Platelets 322  150 - 400 K/uL   Neutrophils Relative % 48  43 - 77 %   Neutro Abs 3.8  1.7 - 7.7 K/uL   Lymphocytes Relative 36  12 - 46 %   Lymphs Abs 2.9  0.7 - 4.0 K/uL   Monocytes Relative 13 (*) 3 - 12 %   Monocytes Absolute 1.0  0.1 - 1.0 K/uL   Eosinophils Relative 2  0 - 5 %   Eosinophils Absolute 0.2  0.0 - 0.7 K/uL   Basophils Relative 1  0 - 1 %   Basophils Absolute 0.0  0.0 - 0.1 K/uL  POCT I-STAT TROPONIN I     Status: None   Collection Time    02/10/13  2:40 AM      Result Value Range   Troponin i, poc 0.00  0.00 - 0.08 ng/mL   Comment 3             Comment: Due to the release kinetics of cTnI,     a negative result within the first hours     of the onset of symptoms does not rule out     myocardial infarction with certainty.     If myocardial infarction is still suspected,     repeat the test at appropriate intervals.   Dg Chest Port 1 View  02/10/2013   *RADIOLOGY REPORT*  Clinical Data: Chest pain.  PORTABLE CHEST - 1 VIEW  Comparison: 02/04/2013.  Findings: No significant osseous abnormality.  Lungs are clear of acute infiltrates.  There is minimal right basilar scarring or atelectasis.  No effusion or pneumothorax.  Cardiomediastinal size and contour are within normal limits when accounting for probable pericardial fat pad.  The upper abdomen is unremarkable.  IMPRESSION: No evidence of  acute cardiopulmonary disease.   Original Report Authenticated By: Tiburcio Pea    @ROS @ Denies recent weight gain or weight loss. Wears glasses. Has a left eye injury 25 years ago. Has recurrent chest pain. No history of asthma, COPD, palpitations, leg swelling, GI bleed, hepatitis, blood transfusion, kidney stone, stroke, seizures or psychiatric admissions.   Blood pressure 139/94, pulse 74, temperature 97.9 F (36.6 C), temperature source Oral, resp. rate 21, height 5\' 6"  (1.676 m), weight 77.111 kg (170 lb), SpO2 94.00%. GENERAL: Patient is alert and oriented x3.  HEENT: Patient is normocephalic, atraumatic. Extraocular movement intact. Conjunctivae are pink. Sclerae are nonicteric. He has unequal pupils, but reacting to light.  NECK: No JVD. No carotid bruit. No thyromegaly. Full range of motion of the neck.  LUNGS: Clear bilaterally.  HEART: Normal S1, S2 with a grade II/VI systolic murmur.  ABDOMEN: Soft and nontender.  EXTREMITIES: No edema, cyanosis or clubbing.  CNS: Cranial nerves grossly intact. Patient moves all 4 extremities. SKIN: Warm and dry  Assessment/Plan Unstable angina  CAD  S/P stent in LAD/Diagonal  IV  NTG/Home medications and IV heparin.  [name] S 02/10/2013, 3:40 AM

## 2013-02-10 NOTE — Progress Notes (Signed)
ANTICOAGULATION CONSULT NOTE - Follow Up Consult  Pharmacy Consult for heparin Indication: chest pain/ACS  No Known Allergies  Patient Measurements: Height: 5\' 6"  (167.6 cm) Weight: 166 lb 7.2 oz (75.5 kg) IBW/kg (Calculated) : 63.8 Heparin Dosing Weight: 75.5kg  Vital Signs: Temp: 98.3 F (36.8 C) (09/02 1607) Temp src: Oral (09/02 1607) BP: 122/78 mmHg (09/02 1700) Pulse Rate: 71 (09/02 1700)  Labs:  Recent Labs  02/10/13 0230 02/10/13 1000 02/10/13 1100 02/10/13 1723  HGB 13.5  --   --   --   HCT 39.1  --   --   --   PLT 322  --   --   --   HEPARINUNFRC  --   --  0.57 0.64  TROPONINI  --  <0.30  --  <0.30    Estimated Creatinine Clearance: 56.9 ml/min (by C-G formula based on Cr of 1.2).   Assessment: [name] who had a cath last week and returns with chest pain since last evening. Heparin was started this morning and first level was therapeutic at 0.57; a confirmatory level drawn at 1800 was also therapeutic at 0.64. His CBC is WNL and no bleeding or complications noted.  Goal of Therapy:  Heparin level 0.3-0.7 units/ml Monitor platelets by anticoagulation protocol: Yes   Plan:  1. Continue heparin at 1000units/hr 2. Daily HL and CBC 3. Follow up cath plans and for any complications or bleeding  Felicha Frayne D. Alyla Pietila, PharmD Clinical Pharmacist Pager: [phone] 02/10/2013 6:44 PM

## 2013-02-10 NOTE — ED Provider Notes (Signed)
CSN: [id number]     Arrival date & time 02/10/13  0209 History   First MD Initiated Contact with Patient 02/10/13 0216     Chief Complaint  Patient presents with  . Chest Pain   (Consider location/radiation/quality/duration/timing/severity/associated sxs/prior Treatment) Patient is a 62 y.o. male presenting with chest pain. The history is provided by the patient.  Chest Pain Pain location:  L chest Pain quality: dull and sharp   Pain radiates to:  Does not radiate Pain radiates to the back: no   Pain severity:  Severe Onset quality:  Sudden Timing:  Constant Progression:  Partially resolved Chronicity:  Recurrent Context: at rest   Relieved by:  Nitroglycerin Worsened by:  Nothing tried Ineffective treatments:  None tried Associated symptoms: diaphoresis   Associated symptoms: no cough and no shortness of breath   Risk factors: coronary artery disease and male sex     Past Medical History  Diagnosis Date  . Hypertension   . CAD (coronary artery disease)   . High cholesterol   . Anginal pain   . Gout    Past Surgical History  Procedure Laterality Date  . Cardiac catheterization  2000; 2007    "1 + 2" (02/04/2013)  . Coronary angioplasty  02/04/2013   No family history on file. History  Substance Use Topics  . Smoking status: Never Smoker   . Smokeless tobacco: Never Used  . Alcohol Use: 7.2 oz/week    12 Cans of beer per week     Comment: 02/04/2013 "couple beers, 4-5 times/wk; 12 pack/wk"    Review of Systems  Constitutional: Positive for diaphoresis.  Respiratory: Positive for chest tightness. Negative for cough and shortness of breath.   Cardiovascular: Positive for chest pain. Negative for leg swelling.  All other systems reviewed and are negative.    Allergies  Review of patient's allergies indicates no known allergies.  Home Medications   Current Outpatient Rx  Name  Route  Sig  Dispense  Refill  . aspirin EC 81 MG EC tablet   Oral   Take 1 tablet  (81 mg total) by mouth daily.         . isosorbide mononitrate (IMDUR) 60 MG 24 hr tablet   Oral   Take 60 mg by mouth 2 (two) times daily.         Marland Kitchen lisinopril (PRINIVIL,ZESTRIL) 10 MG tablet   Oral   Take 10 mg by mouth 2 (two) times daily.         . metoprolol tartrate (LOPRESSOR) 25 MG tablet   Oral   Take 12.5 mg by mouth 2 (two) times daily.         . pravastatin (PRAVACHOL) 40 MG tablet   Oral   Take 40 mg by mouth 2 (two) times daily.         . Ticagrelor (BRILINTA) 90 MG TABS tablet   Oral   Take 1 tablet (90 mg total) by mouth 2 (two) times daily.   60 tablet   6    BP 145/94  Temp(Src) 97.9 F (36.6 C) (Oral)  Resp 17  SpO2 99% Physical Exam  Constitutional: He is oriented to person, place, and time. He appears well-developed and well-nourished.  HENT:  Head: Normocephalic and atraumatic.  Mouth/Throat: Oropharynx is clear and moist.  Eyes: Conjunctivae are normal. Pupils are equal, round, and reactive to light.  Neck: Normal range of motion. Neck supple.  Cardiovascular: Normal rate, regular rhythm and intact distal pulses.  Pulmonary/Chest: Effort normal and breath sounds normal. He has no wheezes. He has no rales.  Abdominal: Soft. Bowel sounds are normal. There is no tenderness. There is no rebound and no guarding.  Musculoskeletal: Normal range of motion.  Neurological: He is alert and oriented to person, place, and time.  Skin: Skin is warm and dry. He is not diaphoretic.  Psychiatric: He has a normal mood and affect.    ED Course  Procedures (including critical care time) Labs Review Labs Reviewed  CBC WITH DIFFERENTIAL   Imaging Review No results found.  MDM   Date: 02/10/2013  Rate: 79  Rhythm: normal sinus rhythm  QRS Axis: left  Intervals: normal  ST/T Wave abnormalities: nonspecific ST changes  Conduction Disutrbances:none  Narrative Interpretation:   Old EKG Reviewed: unchanged     Zareen Jamison K Adin Laker-Rasch,  MD 02/10/13 [phone]

## 2013-02-11 ENCOUNTER — Encounter (HOSPITAL_COMMUNITY)
Admission: EM | Disposition: A | Discharge status: Home / Self Care | Payer: Self-pay | Source: Home / Self Care | Attending: Cardiovascular Disease

## 2013-02-11 HISTORY — PX: LEFT HEART CATHETERIZATION WITH CORONARY ANGIOGRAM: SHX5451

## 2013-02-11 LAB — LIPID PANEL
LDL Cholesterol: 113 mg/dL — ABNORMAL HIGH (ref 0–99)
Triglycerides: 159 mg/dL — ABNORMAL HIGH (ref <150)
VLDL: 32 mg/dL (ref 0–40)

## 2013-02-11 LAB — BASIC METABOLIC PANEL
BUN: 16 mg/dL (ref 6–23)
GFR calc non Af Amer: 59 mL/min — ABNORMAL LOW (ref >90)
Glucose, Bld: 97 mg/dL (ref 70–99)
Potassium: 4.9 mEq/L (ref 3.5–5.1)

## 2013-02-11 LAB — HEPARIN LEVEL (UNFRACTIONATED): Heparin Unfractionated: 0.79 IU/mL — ABNORMAL HIGH (ref 0.30–0.70)

## 2013-02-11 LAB — CBC
HCT: 40 % (ref 39.0–52.0)
Hemoglobin: 14 g/dL (ref 13.0–17.0)
MCHC: 35 g/dL (ref 30.0–36.0)
MCV: 90.3 fL (ref 78.0–100.0)

## 2013-02-11 SURGERY — LEFT HEART CATHETERIZATION WITH CORONARY ANGIOGRAM
Anesthesia: LOCAL

## 2013-02-11 MED ORDER — ASPIRIN 81 MG PO CHEW: 324.0000 mg | CHEWABLE_TABLET | ORAL | Status: DC

## 2013-02-11 MED ORDER — ALPRAZOLAM 0.25 MG PO TABS: 0.2500 mg | ORAL_TABLET | Freq: Two times a day (BID) | ORAL | Status: DC | PRN

## 2013-02-11 MED ORDER — SODIUM CHLORIDE 0.9 % IJ SOLN: 3.0000 mL | Freq: Two times a day (BID) | INTRAMUSCULAR | Status: DC

## 2013-02-11 MED ORDER — ISOSORBIDE MONONITRATE ER 60 MG PO TB24
60.0000 mg | ORAL_TABLET | Freq: Two times a day (BID) | ORAL | Status: DC
  Filled 2013-02-11: qty 1

## 2013-02-11 MED ORDER — NITROGLYCERIN 0.2 MG/ML ON CALL CATH LAB
INTRAVENOUS | Status: AC
  Filled 2013-02-11: qty 1

## 2013-02-11 MED ORDER — RANOLAZINE ER 500 MG PO TB12: 500.0000 mg | ORAL_TABLET | Freq: Two times a day (BID) | ORAL | Status: DC

## 2013-02-11 MED ORDER — SODIUM CHLORIDE 0.9 % IJ SOLN: 3.0000 mL | INTRAMUSCULAR | Status: DC | PRN

## 2013-02-11 MED ORDER — NITROGLYCERIN 0.4 MG SL SUBL: 0.4000 mg | SUBLINGUAL_TABLET | SUBLINGUAL | Status: DC | PRN

## 2013-02-11 MED ORDER — SODIUM CHLORIDE 0.9 % IV SOLN
1.0000 mL/kg/h | INTRAVENOUS | Status: DC
Start: 2013-02-11 — End: 2013-02-11

## 2013-02-11 MED ORDER — SODIUM CHLORIDE 0.9 % IV SOLN: INTRAVENOUS | Status: DC

## 2013-02-11 MED ORDER — MIDAZOLAM HCL 2 MG/2ML IJ SOLN
INTRAMUSCULAR | Status: AC
  Filled 2013-02-11: qty 2

## 2013-02-11 MED ORDER — DIAZEPAM 5 MG PO TABS: 5.0000 mg | ORAL_TABLET | ORAL | Status: DC

## 2013-02-11 MED ORDER — LIDOCAINE HCL (PF) 1 % IJ SOLN
INTRAMUSCULAR | Status: AC
  Filled 2013-02-11: qty 30

## 2013-02-11 MED ORDER — CLOPIDOGREL BISULFATE 75 MG PO TABS
75.0000 mg | ORAL_TABLET | Freq: Once | ORAL | Status: DC
  Filled 2013-02-11: qty 1

## 2013-02-11 MED ORDER — HEPARIN (PORCINE) IN NACL 2-0.9 UNIT/ML-% IJ SOLN
INTRAMUSCULAR | Status: AC
  Filled 2013-02-11: qty 1000

## 2013-02-11 MED ORDER — AMLODIPINE BESYLATE 2.5 MG PO TABS: 2.5000 mg | ORAL_TABLET | Freq: Every day | ORAL | Status: DC

## 2013-02-11 MED ORDER — SODIUM CHLORIDE 0.9 % IV SOLN: 250.0000 mL | INTRAVENOUS | Status: DC | PRN

## 2013-02-11 MED ORDER — FENTANYL CITRATE 0.05 MG/ML IJ SOLN
INTRAMUSCULAR | Status: AC
  Filled 2013-02-11: qty 2

## 2013-02-11 NOTE — Discharge Summary (Signed)
Physician Discharge Summary  Patient ID: [name] MRN: [id number] DOB/AGE: 09-27-1950 62 y.o.  Admit date: 02/10/2013 Discharge date: 02/11/2013  Admission Diagnoses: Unstable angina  CAD  S/P stent in LAD/Diagonal  Discharge Diagnoses:  Principle Problem: * Unstable angina * CAD  S/P stent in LAD/Diagonal Anxiety  Discharged Condition: fair  Hospital Course: 62 years old male with recent diagonal angioplasty had recurrent chest pain which was left sided, dull, non-radiating, mostly relieved by NTG and associated with diaphoresis and not with cough or shortness of breath. He underwent cardiac catheterization that was essentially unchanged. Ranolazine and Xanax were added and he was discharged home in stable condition.  Consults: cardiology  Significant Diagnostic Studies: labs: Normal CBC, BMET and cardiac enzymes and EKG.  Cardiac cath showed mild in stent disease of LAD stent and osteal 60 % in-stent disease of diagonal vessel.  Treatments: cardiac meds: Aspirin, lisinopril (Zestril), metoprolol, amlodipine, Brilinta and Ranolazine.  Discharge Exam: Blood pressure 137/76, pulse 71, temperature 98 F (36.7 C), temperature source Oral, resp. rate 18, height 5\' 6"  (1.676 m), weight 75.66 kg (166 lb 12.8 oz), SpO2 99.00%.  GENERAL: Patient is alert and oriented x3.  HEENT: Patient is normocephalic, atraumatic. Extraocular movement intact. Conjunctivae are pink. Sclerae are nonicteric. He has unequal pupils, but reacting to light.  NECK: No JVD. No carotid bruit. No thyromegaly. Full range of motion of the neck.  LUNGS: Clear bilaterally.  HEART: Normal S1, S2 with a grade II/VI systolic murmur.  ABDOMEN: Soft and nontender.  EXTREMITIES: No edema, cyanosis or clubbing. Right groin without hematoma. CNS: Cranial nerves grossly intact. Patient moves all 4 extremities.  SKIN: Warm and dry  Disposition: 01-Home or Self Care     Medication List         ALPRAZolam 0.25  MG tablet  Commonly known as:  XANAX  Take 1 tablet (0.25 mg total) by mouth 2 (two) times daily as needed for sleep or anxiety.     amLODipine 2.5 MG tablet  Commonly known as:  NORVASC  Take 1 tablet (2.5 mg total) by mouth daily.     aspirin 81 MG EC tablet  Take 1 tablet (81 mg total) by mouth daily.     isosorbide mononitrate 60 MG 24 hr tablet  Commonly known as:  IMDUR  Take 60 mg by mouth 2 (two) times daily.     lisinopril 10 MG tablet  Commonly known as:  PRINIVIL,ZESTRIL  Take 10 mg by mouth 2 (two) times daily.     metoprolol tartrate 25 MG tablet  Commonly known as:  LOPRESSOR  Take 12.5 mg by mouth 2 (two) times daily.     nitroGLYCERIN 0.4 MG SL tablet  Commonly known as:  NITROSTAT  Place 1 tablet (0.4 mg total) under the tongue every 5 (five) minutes as needed for chest pain (CP or SOB).     pravastatin 40 MG tablet  Commonly known as:  PRAVACHOL  Take 40 mg by mouth 2 (two) times daily.     ranolazine 500 MG 12 hr tablet  Commonly known as:  RANEXA  Take 1 tablet (500 mg total) by mouth 2 (two) times daily.     Ticagrelor 90 MG Tabs tablet  Commonly known as:  BRILINTA  Take 1 tablet (90 mg total) by mouth 2 (two) times daily.           Follow-up Information   Follow up with [facility] S, MD. Schedule an appointment as soon as  possible for a visit in 3 weeks.   Specialty:  Cardiology   Contact information:   [address] [place] [address] [phone]       Signed: [name] 02/11/2013, 7:30 PM

## 2013-02-11 NOTE — Progress Notes (Signed)
Spoke with Dr.Kadakia via phone re:pt. Bed status. States he wishes pt. To remain in ICU until bedrest complete with possible plans for dc. Will continue to monitor closely. Gildardo Pounds

## 2013-02-11 NOTE — CV Procedure (Signed)
PROCEDURE:  Left heart catheterization with selective coronary angiography.  CLINICAL HISTORY:  This is a 62 year old male with recent Angioplasty to old Diagonal stent has prolonged chest pain..  The risks, benefits, and details of the procedure were explained to the patient.  The patient verbalized understanding and wanted to proceed.  Informed written consent was obtained.  PROCEDURE TECHNIQUE:  The patient was approached from the right femoral artery using a 5 French short sheath.  Left coronary angiography was done using a Judkins L4 guide catheter.  Right coronary angiography was done using a Judkins R4 guide catheter.  Left ventriculography was done using a pigtail catheter.    CONTRAST:  Total of 50 cc.  COMPLICATIONS:  None.  At the end of the procedure a manual device was used for hemostasis.    HEMODYNAMICS:  Aortic pressure was 140/79.    ANGIOGRAM/CORONARY ARTERIOGRAM:   The left main coronary artery is unremarkable.  The left anterior descending artery has patent stent with post stent 50 % stenosis and instent 30 % stenosis. Diagonal stent has osteal 60 % stenosis. No change in stenosis post NTG use.  The left circumflex artery is unremarkable.  The right coronary artery is dominant and unremarkable.  LEFT VENTRICULOGRAM:  Left ventricular angiogram was not done   IMPRESSION OF HEART CATHETERIZATION:   1. Normal left main coronary artery. 2. Patent stent in left anterior descending artery with mild to moderate disease. Moderate disease of stent in diagonal but improved from 80 % to 60 % stenosis. 3. Normal left circumflex artery and its branches. 4. Normal right coronary artery.  RECOMMENDATION:   Continue medical treatment.

## 2013-02-11 NOTE — Progress Notes (Signed)
Pt. Bedrest complete. Pt ambulates in hall per MD orders. Tolerates well, no c/o SOB or chest pain. HR 85 NSR, BP stable. Upon return to chair Right groin examined. No obvious signs of bleeding or hematoma. MD at bedside and states will return in approx. 1 hour for discharge. Will continue to monitor, educate, assess closely. Gildardo Pounds

## 2013-02-11 NOTE — Progress Notes (Signed)
ANTICOAGULATION CONSULT NOTE - Follow Up Consult  Pharmacy Consult for heparin Indication: chest pain/ACS  No Known Allergies  Patient Measurements: Height: 5\' 6"  (167.6 cm) Weight: 166 lb 12.8 oz (75.66 kg) IBW/kg (Calculated) : 63.8 Heparin Dosing Weight: 75.5kg  Vital Signs: Temp: 98 F (36.7 C) (09/02 2328) Temp src: Oral (09/02 2328) BP: 133/82 mmHg (09/03 0700) Pulse Rate: 63 (09/03 0700)  Labs:  Recent Labs  02/10/13 0230 02/10/13 1000 02/10/13 1100 02/10/13 1723 02/11/13 0640  HGB 13.5  --   --   --  14.0  HCT 39.1  --   --   --  40.0  PLT 322  --   --   --  319  LABPROT  --   --   --   --  12.0  INR  --   --   --   --  0.90  HEPARINUNFRC  --   --  0.57 0.64 0.79*  TROPONINI  --  <0.30  --  <0.30  --     Estimated Creatinine Clearance: 56.9 ml/min (by C-G formula based on Cr of 1.2).   Assessment: 70 YOM who had a cath last week and returns with chest pain. Heparin level slightly supratherapeutic this a.m. and appears to be trending up. No bleeding noted. CBC remains stable.  Goal of Therapy:  Heparin level 0.3-0.7 units/ml Monitor platelets by anticoagulation protocol: Yes   Plan:  1. Decrease heparin to 900 units/hr 2. F/u 6 hour heparin level  Christoper Fabian, PharmD, BCPS Clinical pharmacist, pager [phone] 02/11/2013 7:11 AM

## 2013-02-11 NOTE — Progress Notes (Signed)
Pt return from cath lab via bed. Accompanied by transportation for cath lab. Pt. Stable with HR 80 NSR and bp 120's/80's. Pt on RA and O2 sat >95%. Lungs clear with no evidence of SOB. Right groin visualized. No bleeding noted through occlusive dressing. Right groin palpated area is soft, non-tender with no evidence of hematoma. Auscultated with no evidence of bruit. Right foot with palpable +2-+3 pulses and warm to touch. Pt. States no pain at this time. Will continue to monitor, educate, assess closely. [name]

## 2013-03-08 ENCOUNTER — Emergency Department (HOSPITAL_COMMUNITY): Payer: Self-pay

## 2013-03-08 ENCOUNTER — Encounter (HOSPITAL_COMMUNITY): Payer: Self-pay | Admitting: *Deleted

## 2013-03-08 ENCOUNTER — Observation Stay (HOSPITAL_COMMUNITY)
Admission: EM | Admit: 2013-03-08 | Discharge: 2013-03-09 | Disposition: A | Discharge status: Home / Self Care | Payer: Self-pay | Attending: Cardiology | Admitting: Cardiology

## 2013-03-08 DIAGNOSIS — R0602 Shortness of breath: Secondary | ICD-10-CM | POA: Insufficient documentation

## 2013-03-08 DIAGNOSIS — E7800 Pure hypercholesterolemia, unspecified: Secondary | ICD-10-CM | POA: Insufficient documentation

## 2013-03-08 DIAGNOSIS — I10 Essential (primary) hypertension: Secondary | ICD-10-CM | POA: Insufficient documentation

## 2013-03-08 DIAGNOSIS — R079 Chest pain, unspecified: Principal | ICD-10-CM

## 2013-03-08 DIAGNOSIS — I2510 Atherosclerotic heart disease of native coronary artery without angina pectoris: Secondary | ICD-10-CM

## 2013-03-08 DIAGNOSIS — Z79899 Other long term (current) drug therapy: Secondary | ICD-10-CM | POA: Insufficient documentation

## 2013-03-08 DIAGNOSIS — I517 Cardiomegaly: Secondary | ICD-10-CM | POA: Insufficient documentation

## 2013-03-08 LAB — COMPREHENSIVE METABOLIC PANEL
AST: 18 U/L (ref 0–37)
Albumin: 3.5 g/dL (ref 3.5–5.2)
Calcium: 9.2 mg/dL (ref 8.4–10.5)
Chloride: 106 mEq/L (ref 96–112)
Creatinine, Ser: 1.32 mg/dL (ref 0.50–1.35)
GFR calc non Af Amer: 56 mL/min — ABNORMAL LOW (ref >90)
Glucose, Bld: 103 mg/dL — ABNORMAL HIGH (ref 70–99)
Potassium: 4.7 mEq/L (ref 3.5–5.1)
Total Bilirubin: 0.7 mg/dL (ref 0.3–1.2)
Total Protein: 6.9 g/dL (ref 6.0–8.3)

## 2013-03-08 LAB — CBC
HCT: 39.4 % (ref 39.0–52.0)
Hemoglobin: 13.5 g/dL (ref 13.0–17.0)
MCV: 89.5 fL (ref 78.0–100.0)
RBC: 4.4 MIL/uL (ref 4.22–5.81)
WBC: 6.2 10*3/uL (ref 4.0–10.5)

## 2013-03-08 LAB — BASIC METABOLIC PANEL
BUN: 15 mg/dL (ref 6–23)
CO2: 22 mEq/L (ref 19–32)
Chloride: 102 mEq/L (ref 96–112)
Creatinine, Ser: 1.25 mg/dL (ref 0.50–1.35)
Glucose, Bld: 100 mg/dL — ABNORMAL HIGH (ref 70–99)

## 2013-03-08 LAB — CBC WITH DIFFERENTIAL/PLATELET
Basophils Absolute: 0 10*3/uL (ref 0.0–0.1)
Basophils Relative: 0 % (ref 0–1)
Eosinophils Relative: 1 % (ref 0–5)
Lymphocytes Relative: 28 % (ref 12–46)
MCH: 32 pg (ref 26.0–34.0)
Neutro Abs: 4.7 10*3/uL (ref 1.7–7.7)
Platelets: 302 10*3/uL (ref 150–400)
RDW: 13.4 % (ref 11.5–15.5)
WBC: 8.1 10*3/uL (ref 4.0–10.5)

## 2013-03-08 LAB — APTT: aPTT: >200 seconds (ref 24–37)

## 2013-03-08 LAB — POCT I-STAT TROPONIN I: Troponin i, poc: 0.03 ng/mL (ref 0.00–0.08)

## 2013-03-08 LAB — PROTIME-INR
INR: 1.06 (ref 0.00–1.49)
Prothrombin Time: 13.6 seconds (ref 11.6–15.2)

## 2013-03-08 MED ORDER — ACETAMINOPHEN 325 MG PO TABS: 650.0000 mg | ORAL_TABLET | ORAL | Status: DC | PRN

## 2013-03-08 MED ORDER — ASPIRIN EC 81 MG PO TBEC: 81.0000 mg | DELAYED_RELEASE_TABLET | Freq: Every day | ORAL | Status: DC

## 2013-03-08 MED ORDER — AMLODIPINE BESYLATE 2.5 MG PO TABS
2.5000 mg | ORAL_TABLET | Freq: Every day | ORAL | Status: DC
  Administered 2013-03-09: 2.5 mg via ORAL
  Filled 2013-03-08: qty 1

## 2013-03-08 MED ORDER — NITROGLYCERIN IN D5W 200-5 MCG/ML-% IV SOLN
5.0000 ug/min | INTRAVENOUS | Status: DC
  Administered 2013-03-08: 5 ug/min via INTRAVENOUS

## 2013-03-08 MED ORDER — ASPIRIN EC 81 MG PO TBEC
81.0000 mg | DELAYED_RELEASE_TABLET | Freq: Every day | ORAL | Status: DC
  Administered 2013-03-09: 81 mg via ORAL
  Filled 2013-03-08: qty 1

## 2013-03-08 MED ORDER — ATORVASTATIN CALCIUM 80 MG PO TABS
80.0000 mg | ORAL_TABLET | Freq: Every day | ORAL | Status: DC
  Administered 2013-03-09: 80 mg via ORAL
  Filled 2013-03-08: qty 1

## 2013-03-08 MED ORDER — CLOPIDOGREL BISULFATE 75 MG PO TABS
300.0000 mg | ORAL_TABLET | Freq: Once | ORAL | Status: AC
  Administered 2013-03-08: 300 mg via ORAL
  Filled 2013-03-08: qty 4

## 2013-03-08 MED ORDER — HEPARIN BOLUS VIA INFUSION
4000.0000 [IU] | Freq: Once | INTRAVENOUS | Status: AC
  Administered 2013-03-08: 4000 [IU] via INTRAVENOUS
  Filled 2013-03-08: qty 4000

## 2013-03-08 MED ORDER — METOPROLOL TARTRATE 12.5 MG HALF TABLET
12.5000 mg | ORAL_TABLET | Freq: Two times a day (BID) | ORAL | Status: DC
  Administered 2013-03-08 – 2013-03-09 (×2): 12.5 mg via ORAL
  Filled 2013-03-08 (×3): qty 1

## 2013-03-08 MED ORDER — SODIUM CHLORIDE 0.9 % IV SOLN
INTRAVENOUS | Status: DC
  Administered 2013-03-08: 23:00:00 via INTRAVENOUS

## 2013-03-08 MED ORDER — ONDANSETRON HCL 4 MG/2ML IJ SOLN: 4.0000 mg | Freq: Four times a day (QID) | INTRAMUSCULAR | Status: DC | PRN

## 2013-03-08 MED ORDER — FAMOTIDINE 20 MG PO TABS
20.0000 mg | ORAL_TABLET | Freq: Two times a day (BID) | ORAL | Status: DC
  Administered 2013-03-08: 20 mg via ORAL
  Filled 2013-03-08 (×3): qty 1

## 2013-03-08 MED ORDER — ASPIRIN 81 MG PO CHEW
324.0000 mg | CHEWABLE_TABLET | ORAL | Status: AC
  Administered 2013-03-08: 324 mg via ORAL
  Filled 2013-03-08: qty 4

## 2013-03-08 MED ORDER — ALPRAZOLAM 0.25 MG PO TABS: 0.2500 mg | ORAL_TABLET | Freq: Two times a day (BID) | ORAL | Status: DC | PRN

## 2013-03-08 MED ORDER — NITROGLYCERIN 0.4 MG SL SUBL: 0.4000 mg | SUBLINGUAL_TABLET | SUBLINGUAL | Status: DC | PRN

## 2013-03-08 MED ORDER — NITROGLYCERIN 2 % TD OINT
1.0000 [in_us] | TOPICAL_OINTMENT | Freq: Once | TRANSDERMAL | Status: AC
  Administered 2013-03-08: 1 [in_us] via TOPICAL
  Filled 2013-03-08: qty 1

## 2013-03-08 MED ORDER — ASPIRIN 300 MG RE SUPP
300.0000 mg | RECTAL | Status: AC
  Filled 2013-03-08: qty 1

## 2013-03-08 MED ORDER — CLOPIDOGREL BISULFATE 75 MG PO TABS
75.0000 mg | ORAL_TABLET | Freq: Every day | ORAL | Status: DC
  Administered 2013-03-09: 75 mg via ORAL
  Filled 2013-03-08: qty 1

## 2013-03-08 MED ORDER — MORPHINE SULFATE 4 MG/ML IJ SOLN
4.0000 mg | Freq: Once | INTRAMUSCULAR | Status: AC
  Administered 2013-03-08: 4 mg via INTRAVENOUS
  Filled 2013-03-08: qty 1

## 2013-03-08 MED ORDER — HEPARIN (PORCINE) IN NACL 100-0.45 UNIT/ML-% IJ SOLN
1000.0000 [IU]/h | INTRAMUSCULAR | Status: DC
  Administered 2013-03-08: 1100 [IU]/h via INTRAVENOUS
  Administered 2013-03-09: 1000 [IU]/h via INTRAVENOUS
  Filled 2013-03-08 (×3): qty 250

## 2013-03-08 MED ORDER — NITROGLYCERIN IN D5W 200-5 MCG/ML-% IV SOLN
5.0000 ug/min | Freq: Once | INTRAVENOUS | Status: AC
  Administered 2013-03-08: 5 ug/min via INTRAVENOUS
  Filled 2013-03-08: qty 250

## 2013-03-08 NOTE — Progress Notes (Signed)
ANTICOAGULATION CONSULT NOTE - Initial Consult  Pharmacy Consult for heparin Indication: chest pain/ACS  No Known Allergies  Patient Measurements: Height: 5\' 6"  (167.6 cm) Weight: 169 lb 6 oz (76.828 kg) IBW/kg (Calculated) : 63.8   Vital Signs: Temp: 98.4 F (36.9 C) (09/28 1340) Temp src: Oral (09/28 1340) BP: 139/89 mmHg (09/28 1728) Pulse Rate: 83 (09/28 1728)  Labs:  Recent Labs  03/08/13 1430  HGB 13.5  HCT 39.4  PLT 282  CREATININE 1.25    Estimated Creatinine Clearance: 59 ml/min (by C-G formula based on Cr of 1.25).   Medical History: Past Medical History  Diagnosis Date  . Hypertension   . CAD (coronary artery disease)   . High cholesterol   . Anginal pain   . Gout     Assessment: 62 yo male her with CP to begin heparin.  Goal of Therapy:  Heparin level 0.3-0.7 units/ml Monitor platelets by anticoagulation protocol: Yes   Plan:  -Heparin bolus 4000 units IV followed by 1100 units/hr (~14 units/kg/hr) -Heparin level in 6 hours and daily wth CBC daily  Harland German, Pharm D 03/08/2013 6:18 PM   e

## 2013-03-08 NOTE — ED Provider Notes (Signed)
This is a sign out from [place] at shift change: [name] is a 62 y.o. male complaining of chest pain onset 40 minutes prior to arrival. Patient has known coronary artery disease status post stent placement. Patient has been noncompliant with planned medication secondary to cost. Chest pain was relieved quickly by nitroglycerin. He Chad has consulted Dr. Sharyn Lull who is covering for Dr. Algie Coffer. Plan is to followup troponin and anxious he is chest pain-free before discharge to home. Aas per Harwani: Increase in door to 120 mg daily and start Plavix 75 mg daily.  Patient seen and evaluated at 17:35 PM patient states that he still having chest pain however does migrated to the right side. Troponin was resulted negative, however I do not feel comfortable sending him home with active chest pain. I will reconsult cardiology.  Dr. Sharyn Lull will admit the patient, nitroglycerin and heparin per pharmacy consult ordered.  Wynetta Emery, PA-C 03/08/13 1828

## 2013-03-08 NOTE — ED Provider Notes (Signed)
Date: 03/08/2013  Rate: 84  Rhythm: normal sinus rhythm  QRS Axis: normal  Intervals: normal  ST/T Wave abnormalities: nonspecific T wave changes  Conduction Disutrbances:left anterior fascicular block  Narrative Interpretation:   Old EKG Reviewed: unchanged    Trixie Dredge, PA-C 03/08/13 1645

## 2013-03-08 NOTE — ED Provider Notes (Signed)
CSN: [id number]     Arrival date & time 03/08/13  1334 History   First MD Initiated Contact with Patient 03/08/13 1344     Chief Complaint  Patient presents with  . Chest Pain   (Consider location/radiation/quality/duration/timing/severity/associated sxs/prior Treatment) HPI Comments: Patient with hx CAD and angina with multiple stent placements (2000, 2007) and with PTCA with cutting balloon of diagonal stent that reduced stenosis from 80 to 50% 02/04/13, admitted recently for chest pain on 02/10/13 with repeat cath that was unchanged, presents today with chest pain.  Pain is left sided, began at 1:00pm while he was watching tv, relieved with nitroglycerin, but relieved only temporarily.  He took 3 doses of nitroglycerin, each helped but only about 10 minutes each time.  Pain is currently 5/10, was 7/10 at its worst.  Denies associated SOB, sweating, N/V. Pt is not taking the Brilinta he was   Patient is a 62 y.o. male presenting with chest pain. The history is provided by the patient and medical records.  Chest Pain Associated symptoms: no cough, no fever, no nausea, no numbness, no shortness of breath, not vomiting and no weakness     Past Medical History  Diagnosis Date  . Hypertension   . CAD (coronary artery disease)   . High cholesterol   . Anginal pain   . Gout    Past Surgical History  Procedure Laterality Date  . Cardiac catheterization  2000; 2007    "1 + 2" (02/04/2013)  . Coronary angioplasty  02/04/2013   History reviewed. No pertinent family history. History  Substance Use Topics  . Smoking status: Never Smoker   . Smokeless tobacco: Never Used  . Alcohol Use: 7.2 oz/week    12 Cans of beer per week     Comment: 02/04/2013 "couple beers, 4-5 times/wk; 12 pack/wk"    Review of Systems  Constitutional: Negative for fever and chills.  HENT: Negative for sore throat.   Respiratory: Negative for cough, shortness of breath and wheezing.   Cardiovascular: Positive for  chest pain.  Gastrointestinal: Negative for nausea, vomiting and anal bleeding.  Neurological: Negative for weakness and numbness.  All other systems reviewed and are negative.    Allergies  Review of patient's allergies indicates no known allergies.  Home Medications   Current Outpatient Rx  Name  Route  Sig  Dispense  Refill  . ALPRAZolam (XANAX) 0.25 MG tablet   Oral   Take 0.25 mg by mouth 2 (two) times daily as needed for anxiety.         Marland Kitchen amLODipine (NORVASC) 2.5 MG tablet   Oral   Take 2.5 mg by mouth daily.         Marland Kitchen aspirin EC 81 MG tablet   Oral   Take 81 mg by mouth daily.         . isosorbide mononitrate (IMDUR) 60 MG 24 hr tablet   Oral   Take 60 mg by mouth 2 (two) times daily.         Marland Kitchen lisinopril (PRINIVIL,ZESTRIL) 10 MG tablet   Oral   Take 10 mg by mouth daily.          . metoprolol tartrate (LOPRESSOR) 25 MG tablet   Oral   Take 12.5 mg by mouth 2 (two) times daily.         . nitroGLYCERIN (NITROSTAT) 0.4 MG SL tablet   Sublingual   Place 0.4 mg under the tongue every 5 (five) minutes as needed  for chest pain.         . pravastatin (PRAVACHOL) 40 MG tablet   Oral   Take 40 mg by mouth 2 (two) times daily.         . ranolazine (RANEXA) 500 MG 12 hr tablet   Oral   Take 500 mg by mouth 2 (two) times daily.          BP 144/94  Pulse 81  Temp(Src) 98.4 F (36.9 C) (Oral)  Resp 20  Ht 5\' 6"  (1.676 m)  Wt 169 lb 6 oz (76.828 kg)  BMI 27.35 kg/m2  SpO2 96% Physical Exam  Nursing note and vitals reviewed. Constitutional: He appears well-developed and well-nourished. No distress.  HENT:  Head: Normocephalic and atraumatic.  Neck: Neck supple.  Cardiovascular: Normal rate, regular rhythm and intact distal pulses.   Pulmonary/Chest: Effort normal and breath sounds normal. No respiratory distress. He has no wheezes. He has no rales. He exhibits no tenderness.  Abdominal: Soft. He exhibits no distension and no mass. There is  no tenderness. There is no rebound and no guarding.  Musculoskeletal: He exhibits no edema.  Neurological: He is alert. He exhibits normal muscle tone.  Skin: He is not diaphoretic.    ED Course  Procedures (including critical care time) Labs Review Labs Reviewed  BASIC METABOLIC PANEL - Abnormal; Notable for the following:    Glucose, Bld 100 (*)    GFR calc non Af Amer 60 (*)    GFR calc Af Amer 69 (*)    All other components within normal limits  CBC  POCT I-STAT TROPONIN I   Imaging Review Dg Chest 2 View  03/08/2013   CLINICAL DATA:  Initial encounter for current episode of chest pain and shortness of breath. Prior history of coronary artery stenting.  EXAM: CHEST  2 VIEW  COMPARISON:  Portable chest x-ray 02/10/2013, 8/20 7/214. Two-view chest x-ray 11/08/2012, 01/06/2006.  FINDINGS: Cardiac silhouette mildly enlarged, slightly increased in size since 2007. Hilar and mediastinal contours otherwise unremarkable. Minimal linear scarring in the lingula, unchanged. Lungs otherwise clear. Pulmonary vascularity normal. Bronchovascular markings normal. No pleural effusions. No pneumothorax. Mild degenerative changes involving the thoracic spine.  IMPRESSION: Mild cardiomegaly. No acute cardiopulmonary disease.   Electronically Signed   By: Hulan Saas   On: 03/08/2013 16:02   Discussed patient with Dr Sharyn Lull.  He recommends having patient follow up with Dr Algie Coffer in the office this week.  In the meantime he would like to change patient's Imdur from 60 to 120 and add Plavix 75mg  once daily.  I also discussed this patient with Dr Bebe Shaggy.  As patient's chest pain has not yet resolved though patient has just gotten his nitro paste put on at the time of my reassessment, plan is for repeat marker at 3hrs and reassessment.  If patient continues to have chest pain, plan for new discussion with Dr Sharyn Lull.    MDM   1. Chest pain   2. CAD (coronary artery disease)      Patient with  known CAD with recent PTCA of stent but with continued stenosis (50%) of diagonal stent, presenting with left sided chest pain, no associated symptoms.  Please see discussion above with Drs. Harwani and Wickline.  Plan is for second troponin and reassessment to determine d/c home with close follow up with Dr Algie Coffer and medication changes (see above) vs new consult with Dr Sharyn Lull to discuss reassessment.   Discussed patient with Wynetta Emery, PA-C,  who assumes care of patient at change of shift.      Trixie Dredge, PA-C 03/08/13 1634

## 2013-03-08 NOTE — ED Provider Notes (Signed)
Medical screening examination/treatment/procedure(s) were performed by non-physician practitioner and as supervising physician I was immediately available for consultation/collaboration.   William Gratia Disla, MD 03/08/13 2326 

## 2013-03-08 NOTE — ED Notes (Signed)
Heparin and Nitro to be started per Dr. Sharyn Lull. PT remains pain free.

## 2013-03-08 NOTE — ED Notes (Addendum)
EDPA states to hold Nitro drip at this time. Pt CP free. EDPA states to hold Heparin drip/bolus at this time as well

## 2013-03-08 NOTE — ED Notes (Signed)
Pt reports left side sharp chest pains that started 40 mins ago. Denies n/v or sob. Denies recent cough. No acute distress noted at triage, ekg done. Reports taking nitro at home which helped relieve his pain.

## 2013-03-08 NOTE — ED Provider Notes (Signed)
Patient seen/examined in the Emergency Department in conjunction with [facility]  Patient reports chest pain Exam : awake/alert, no distress, reports pain improving Plan: will check cardiac markers and will need cardiology consultation   Joya Gaskins, MD 03/08/13 1437

## 2013-03-08 NOTE — H&P (Signed)
[name] is an 62 y.o. male.   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 Patient denies any nausea vomiting diaphoresis denies any palpitation lightheadedness or syncope denies any shortness of breath denies any relation of chest pain to food breathing or movement. Patient states he did not take Brilinta nor Plavix after PTCA to in-stent restenosis of ostial diagonal 1 as he could not afford it. Patient states pain feels similar in nature when he had PCI to diagonal 1.   Past Medical History  Diagnosis Date  . Hypertension   . CAD (coronary artery disease)   . High cholesterol   . Anginal pain   . Gout     Past Surgical History  Procedure Laterality Date  . Cardiac catheterization  2000; 2007    "1 + 2" (02/04/2013)  . Coronary angioplasty  02/04/2013    History reviewed. No pertinent family history. Social History:  reports that he has never smoked. He has never used smokeless tobacco. He reports that he drinks about 7.2 ounces of alcohol per week. He reports that he does not use illicit drugs.  Allergies: No Known Allergies   (Not in a hospital admission)  Results for orders placed during the hospital encounter of 03/08/13 (from the past 48 hour(s))  CBC     Status: None   Collection Time    03/08/13  2:30 PM       Result Value Range   WBC 6.2  4.0 - 10.5 K/uL   RBC 4.40  4.22 - 5.81 MIL/uL   Hemoglobin 13.5  13.0 - 17.0 g/dL   HCT 40.9  81.1 - 91.4 %   MCV 89.5  78.0 - 100.0 fL   MCH 30.7  26.0 - 34.0 pg   MCHC 34.3  30.0 - 36.0 g/dL   RDW 78.2  95.6 - 21.3 %   Platelets 282  150 - 400 K/uL  BASIC METABOLIC PANEL     Status: Abnormal   Collection Time    03/08/13  2:30 PM      Result Value Range   Sodium 135  135 - 145 mEq/L   Potassium 4.1  3.5 - 5.1 mEq/L   Chloride 102  96 - 112 mEq/L   CO2 22  19 - 32 mEq/L   Glucose, Bld 100 (*) 70 - 99 mg/dL   BUN 15  6 - 23 mg/dL   Creatinine, Ser 0.86  0.50 - 1.35 mg/dL   Calcium 57.8  8.4 - 46.9 mg/dL   GFR calc non Af Amer 60 (*) >90 mL/min   GFR calc Af Amer 69 (*) >90 mL/min   Comment: (NOTE)     The eGFR has been calculated using the CKD EPI equation.     This calculation has not been validated in all clinical situations.     eGFR's persistently <90 mL/min signify possible Chronic Kidney  Disease.  POCT I-STAT TROPONIN I     Status: None   Collection Time    03/08/13  2:36 PM      Result Value Range   Troponin i, poc 0.03  0.00 - 0.08 ng/mL   Comment 3            Comment: Due to the release kinetics of cTnI,     a negative result within the first hours     of the onset of symptoms does not rule out     myocardial infarction with certainty.     If myocardial infarction is still suspected,     repeat the test at appropriate intervals.  POCT I-STAT TROPONIN I     Status: None   Collection Time    03/08/13  5:34 PM      Result Value Range   Troponin i, poc 0.00  0.00 - 0.08 ng/mL   Comment 3            Comment: Due to the release kinetics of cTnI,     a negative result within the first hours     of the onset of symptoms does not rule out     myocardial infarction with certainty.     If myocardial infarction is still suspected,     repeat the test at appropriate intervals.   Dg Chest 2 View  03/08/2013   CLINICAL DATA:   Initial encounter for current episode of chest pain and shortness of breath. Prior history of coronary artery stenting.  EXAM: CHEST  2 VIEW  COMPARISON:  Portable chest x-ray 02/10/2013, 8/20 7/214. Two-view chest x-ray 11/08/2012, 01/06/2006.  FINDINGS: Cardiac silhouette mildly enlarged, slightly increased in size since 2007. Hilar and mediastinal contours otherwise unremarkable. Minimal linear scarring in the lingula, unchanged. Lungs otherwise clear. Pulmonary vascularity normal. Bronchovascular markings normal. No pleural effusions. No pneumothorax. Mild degenerative changes involving the thoracic spine.  IMPRESSION: Mild cardiomegaly. No acute cardiopulmonary disease.   Electronically Signed   By: Hulan Saas   On: 03/08/2013 16:02    Review of Systems  Constitutional: Negative for fever and chills.  HENT: Negative for hearing loss.   Eyes: Negative for blurred vision and double vision.  Respiratory: Negative for cough, hemoptysis, sputum production and shortness of breath.   Cardiovascular: Positive for chest pain. Negative for palpitations, orthopnea, claudication and leg swelling.  Gastrointestinal: Negative for nausea, vomiting and abdominal pain.  Genitourinary: Negative for dysuria.  Skin: Negative for rash.  Neurological: Negative for dizziness and headaches.    Blood pressure 128/83, pulse 83, temperature 98.4 F (36.9 C), temperature source Oral, resp. rate 14, height 5\' 6"  (1.676 m), weight 76.828 kg (169 lb 6 oz), SpO2 92.00%. Physical Exam  Constitutional: He is oriented to person, place, and time. He appears well-developed and well-nourished.  Eyes: Conjunctivae are normal. Pupils are equal, round, and reactive to light. Left eye exhibits no discharge. No scleral icterus.  Neck: Normal range of motion. Neck supple. No JVD present. No tracheal deviation present. No thyromegaly present.  Cardiovascular: Normal rate and regular rhythm.  Exam reveals no gallop and no  friction rub.   No murmur heard. Respiratory: Effort normal and breath sounds normal. No respiratory distress. He has no wheezes.  GI: Soft. Bowel sounds are normal. He exhibits no distension. There is no tenderness. There is no rebound.  Musculoskeletal: He exhibits no edema and no tenderness.  Neurological: He is alert and oriented to person, place, and  time.     Assessment/Plan Unstable angina rule out MI rule out progression of CAD Coronary artery disease status post PCI to LAD/ diagonal in remote past and PTCA to ostial diagonal 1 in-stent restenosis approximately one month ago Hypertension Hypercholesteremia History of gouty arthritis Plan As per orders Will schedule for nuclear stress test in a.m.   Robynn Pane 03/08/2013, 6:58 PM

## 2013-03-09 ENCOUNTER — Observation Stay (HOSPITAL_COMMUNITY): Payer: Self-pay

## 2013-03-09 LAB — CBC
Hemoglobin: 12.5 g/dL — ABNORMAL LOW (ref 13.0–17.0)
MCHC: 34.2 g/dL (ref 30.0–36.0)
RBC: 4.05 MIL/uL — ABNORMAL LOW (ref 4.22–5.81)
WBC: 6.3 10*3/uL (ref 4.0–10.5)

## 2013-03-09 LAB — LIPID PANEL
Cholesterol: 204 mg/dL — ABNORMAL HIGH (ref 0–200)
HDL: 45 mg/dL (ref >39)
LDL Cholesterol: 130 mg/dL — ABNORMAL HIGH (ref 0–99)
Total CHOL/HDL Ratio: 4.5 RATIO
Triglycerides: 143 mg/dL (ref <150)
VLDL: 29 mg/dL (ref 0–40)

## 2013-03-09 LAB — BASIC METABOLIC PANEL
CO2: 25 mEq/L (ref 19–32)
Calcium: 8.9 mg/dL (ref 8.4–10.5)
Chloride: 103 mEq/L (ref 96–112)
GFR calc non Af Amer: 50 mL/min — ABNORMAL LOW (ref >90)
Glucose, Bld: 89 mg/dL (ref 70–99)
Potassium: 4 mEq/L (ref 3.5–5.1)
Sodium: 138 mEq/L (ref 135–145)

## 2013-03-09 LAB — HEPARIN LEVEL (UNFRACTIONATED): Heparin Unfractionated: 0.85 IU/mL — ABNORMAL HIGH (ref 0.30–0.70)

## 2013-03-09 LAB — TROPONIN I: Troponin I: <0.3 ng/mL (ref <0.30)

## 2013-03-09 LAB — TSH: TSH: 1.487 u[IU]/mL (ref 0.350–4.500)

## 2013-03-09 MED ORDER — ATORVASTATIN CALCIUM 80 MG PO TABS: 80.0000 mg | ORAL_TABLET | Freq: Every day | ORAL | Status: DC

## 2013-03-09 MED ORDER — CLOPIDOGREL BISULFATE 75 MG PO TABS: 75.0000 mg | ORAL_TABLET | Freq: Every day | ORAL | Status: DC

## 2013-03-09 MED ORDER — TECHNETIUM TC 99M SESTAMIBI GENERIC - CARDIOLITE
30.0000 | Freq: Once | INTRAVENOUS | Status: AC | PRN
  Administered 2013-03-09: 30 via INTRAVENOUS

## 2013-03-09 MED ORDER — REGADENOSON 0.4 MG/5ML IV SOLN
INTRAVENOUS | Status: AC
  Administered 2013-03-09: 0.4 mg
  Filled 2013-03-09: qty 5

## 2013-03-09 MED ORDER — TECHNETIUM TC 99M SESTAMIBI GENERIC - CARDIOLITE
10.0000 | Freq: Once | INTRAVENOUS | Status: AC | PRN
  Administered 2013-03-09: 10 via INTRAVENOUS

## 2013-03-09 MED ORDER — FAMOTIDINE 20 MG PO TABS: 20.0000 mg | ORAL_TABLET | Freq: Two times a day (BID) | ORAL | Status: DC

## 2013-03-09 NOTE — Progress Notes (Signed)
ANTICOAGULATION CONSULT NOTE - Follow-up Consult  Pharmacy Consult for heparin Indication: chest pain/ACS  No Known Allergies  Patient Measurements: Height: 5\' 6"  (167.6 cm) Weight: 168 lb 12.8 oz (76.567 kg) IBW/kg (Calculated) : 63.8   Vital Signs: Temp: 98.2 F (36.8 C) (09/29 1401) Temp src: Oral (09/29 1401) BP: 132/86 mmHg (09/29 1401) Pulse Rate: 76 (09/29 1401)  Labs:  Recent Labs  03/08/13 1430 03/08/13 2103 03/09/13 0115 03/09/13 0430 03/09/13 1300  HGB 13.5 14.1  --  12.5*  --   HCT 39.4 39.7  --  36.5*  --   PLT 282 302  --  276  --   APTT  --  >200*  --   --   --   LABPROT  --  13.6  --   --   --   INR  --  1.06  --   --   --   HEPARINUNFRC  --   --  0.85*  --  0.60  CREATININE 1.25 1.32  --  1.44*  --   TROPONINI  --  <0.30  --  <0.30 <0.30    Estimated Creatinine Clearance: 51.2 ml/min (by C-G formula based on Cr of 1.44).   Assessment: 62 yo male on heparin for chest pain, r/o ACS. Troponins negative x 3. Nuclear stress test completed today - results show normal stress test. Heparin level remains therapeutic.  Goal of Therapy:  Heparin level 0.3-0.7 units/ml Monitor platelets by anticoagulation protocol: Yes   Plan: Spoke with Dr. Sharyn Lull regarding stress test results - he will review and d/c heparin   Christoper Fabian, PharmD, BCPS Clinical pharmacist, pager [phone]  03/09/2013 4:53 PM

## 2013-03-09 NOTE — Progress Notes (Signed)
Reviewed discharge instructions with patient and he stated his understanding.  Patient wanting to drive himself home and this is ok with Dr. Marni Griffon.  Patient discharged home to self.  Colman Cater

## 2013-03-09 NOTE — Discharge Summary (Signed)
  Discharge summary dictated on 03/09/2013 dictation number is [id number]

## 2013-03-09 NOTE — Progress Notes (Signed)
Utilization review completed.  

## 2013-03-09 NOTE — Progress Notes (Signed)
ANTICOAGULATION CONSULT NOTE - Follow Up Consult  Pharmacy Consult for heparin Indication: chest pain/ACS  Labs:  Recent Labs  03/08/13 1430 03/08/13 2103 03/09/13 0115  HGB 13.5 14.1  --   HCT 39.4 39.7  --   PLT 282 302  --   APTT  --  >200*  --   LABPROT  --  13.6  --   INR  --  1.06  --   HEPARINUNFRC  --   --  0.85*  CREATININE 1.25 1.32  --   TROPONINI  --  <0.30  --     Assessment: 61yo male supratherapeutic on heparin with initial dosing for CP.  Goal of Therapy:  Heparin level 0.3-0.7 units/ml   Plan:  Will decrease heparin gtt by 1-2 units/kg/hr to 1000 units/hr and check level in 6hr.  Vernard Gambles, PharmD, BCPS  03/09/2013,2:03 AM

## 2013-03-09 NOTE — Progress Notes (Signed)
Subjective:  Patient denies any further chest pain or shortness of breath. 3 sets of cardiac enzymes are negative schedule for nuclear stress test today  Objective:  Vital Signs in the last 24 hours: Temp:  [97.3 F (36.3 C)-98.4 F (36.9 C)] 97.8 F (36.6 C) (09/29 0500) Pulse Rate:  [58-105] 93 (09/29 1040) Resp:  [12-21] 16 (09/29 0500) BP: (112-149)/(73-94) 131/75 mmHg (09/29 1040) SpO2:  [92 %-100 %] 98 % (09/29 0500) Weight:  [74.844 kg (165 lb)-76.828 kg (169 lb 6 oz)] 76.567 kg (168 lb 12.8 oz) (09/28 2100)  Intake/Output from previous day: 09/28 0701 - 09/29 0700 In: 240 [P.O.:240] Out: -  Intake/Output from this shift:    Physical Exam: Neck: no adenopathy, no carotid bruit, no JVD and supple, symmetrical, trachea midline Lungs: clear to auscultation bilaterally Heart: regular rate and rhythm, S1, S2 normal, no murmur, click, rub or gallop Abdomen: soft, non-tender; bowel sounds normal; no masses,  no organomegaly Extremities: extremities normal, atraumatic, no cyanosis or edema  Lab Results:  Recent Labs  03/08/13 2103 03/09/13 0430  WBC 8.1 6.3  HGB 14.1 12.5*  PLT 302 276    Recent Labs  03/08/13 2103 03/09/13 0430  NA 139 138  K 4.7 4.0  CL 106 103  CO2 22 25  GLUCOSE 103* 89  BUN 13 17  CREATININE 1.32 1.44*    Recent Labs  03/08/13 2103 03/09/13 0430  TROPONINI <0.30 <0.30   Hepatic Function Panel  Recent Labs  03/08/13 2103  PROT 6.9  ALBUMIN 3.5  AST 18  ALT 12  ALKPHOS 46  BILITOT 0.7    Recent Labs  03/09/13 0430  CHOL 204*   No results found for this basename: PROTIME,  in the last 72 hours  Imaging: Imaging results have been reviewed and Dg Chest 2 View  03/08/2013   CLINICAL DATA:  Initial encounter for current episode of chest pain and shortness of breath. Prior history of coronary artery stenting.  EXAM: CHEST  2 VIEW  COMPARISON:  Portable chest x-ray 02/10/2013, 8/20 7/214. Two-view chest x-ray 11/08/2012,  01/06/2006.  FINDINGS: Cardiac silhouette mildly enlarged, slightly increased in size since 2007. Hilar and mediastinal contours otherwise unremarkable. Minimal linear scarring in the lingula, unchanged. Lungs otherwise clear. Pulmonary vascularity normal. Bronchovascular markings normal. No pleural effusions. No pneumothorax. Mild degenerative changes involving the thoracic spine.  IMPRESSION: Mild cardiomegaly. No acute cardiopulmonary disease.   Electronically Signed   By: Hulan Saas   On: 03/08/2013 16:02    Cardiac Studies:  Assessment/Plan:  Unstable angina rule out MI ruled out  Coronary artery disease status post PCI to LAD/ diagonal in remote past and PTCA to ostial diagonal 1 in-stent restenosis approximately one month ago  Hypertension  Hypercholesteremia  History of gouty arthritis Plan Continue present management Check nuclear stress test results  LOS: 1 day    [name] N 03/09/2013, 12:19 PM

## 2013-03-10 NOTE — ED Provider Notes (Signed)
Medical screening examination/treatment/procedure(s) were conducted as a shared visit with non-physician practitioner(s) and myself.  I personally evaluated the patient during the encounter   Joya Gaskins, MD 03/10/13 2244

## 2013-03-10 NOTE — ED Provider Notes (Signed)
Medical screening examination/treatment/procedure(s) were conducted as a shared visit with non-physician practitioner(s) and myself.  I personally evaluated the patient during the encounter  Pt stable in the ED but given his extensive cardiac history I advised cardiology consultation   Joya Gaskins, MD 03/10/13 2245

## 2013-03-10 NOTE — Discharge Summary (Signed)
NAMEJACQUIS, PAXTON             ACCOUNT NO.:  [id number]  MEDICAL RECORD NO.:  [id number]  LOCATION:  3W26C                        FACILITY:  [facility]  PHYSICIAN:  Eduardo Osier. Sharyn Lull, M.D. DATE OF BIRTH:  09-04-1950  DATE OF ADMISSION:  03/08/2013 DATE OF DISCHARGE:  03/09/2013                              DISCHARGE SUMMARY   ADMITTING DIAGNOSES: 1. Unstable angina, rule out myocardial infarction, rule out     progression of coronary artery disease. 2. Coronary artery disease status post PCI to left anterior     descending/diagonal in remote past and PTCA to ostial diagonal 1     for in-stent restenosis approximately 1 month ago. 3. Hypertension. 4. Hypercholesteremia. 5. History of gouty arthritis.  FINAL DIAGNOSES: 1. Stable angina, myocardial infarction ruled out.  Negative nuclear     stress test. 2. Coronary artery disease, status post PCI to left anterior     descending and diagonal in remote past and PTCA to ostial diagonal     1 for in-stent restenosis recently. 3. Hypertension. 4. Hypercholesteremia. 5. History of gouty arthritis. 6. Gastroesophageal reflux disease.  DISCHARGE HOME MEDICATIONS: 1. Atorvastatin 80 mg, 1 tablet daily. 2. Clopidogrel 75 mg, 1 tablet daily. 3. Pepcid 20 mg twice daily. 4. Xanax 0.25 mg twice daily. 5. Amlodipine 2.5 mg daily. 6. Enteric-coated aspirin 81 mg, 1 tablet daily. 7. Imdur 60 mg twice daily as before. 8. Lisinopril 10 mg daily as before. 9. Lopressor 12.5 mg twice daily as before. 10.Nitroglycerin 0.4 mg sublingual use as directed. 11.Ranexa 500 mg twice daily as before.  The patient has been advised to stop pravastatin.  DIET:  Low salt, low cholesterol.  ACTIVITY:  As tolerated.  FOLLOWUP:  Follow up with Dr. Algie Coffer in 1 week.  CONDITION AT DISCHARGE:  Stable.  BRIEF HISTORY AND HOSPITAL COURSE:  Mr. Hartje is a 62 year old male with past medical history significant for coronary artery disease status post  PTCA to ostial diagonal 1 approximately 1 month ago for in-stent restenosis.  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  Came to the ER complaining of recurrent retrosternal chest pain, which started around 1 p.m. while watching TV, grade 5/10, sharp and dull aching left-sided, took 3 sublingual nitro with relief of chest pain, again started having similar chest pain, so decided to come to the ER.  The patient denies any nausea, vomiting, diaphoresis.  Denies any palpitation, lightheadedness, or syncope.  Denies any shortness of breath.  Denies relation of chest pain to food, breathing, or movement.  The patient states he did not take any Brillianta nor Plavix after PTCA to in-stent restenosis of ostial diagonal 1 as he could not afford it.  The patient states chest pain feels similar in nature when he had PCI to diagonal 1.  PAST MEDICAL HISTORY:  As above.  PAST SURGICAL HISTORY:  None.  PHYSICAL EXAMINATION:  GENERAL:  He is alert, awake, oriented x3. VITAL SIGNS:  Blood pressure was 128/83, pulse  was 83, he was afebrile. EYES:  Conjunctivae was pink. NECK:  Supple.  No JVD.  No bruit. LUNGS:  Clear to auscultation without rhonchi or rales. CARDIOVASCULAR:  S1, S2 was normal.  There was no S3, gallop.  No murmur or rub. ABDOMEN:  Soft.  Bowel sounds present.  Nontender. EXTREMITIES:  There is no clubbing, cyanosis, or edema.  LABORATORY DATA:  Sodium was 135, potassium 4.1, BUN 15, creatinine 1.25, glucose was 150.  Fasting sugar was 89.  Three sets of troponin-I were negative.  Cholesterol was 204, LDL was elevated to 130, triglycerides 143, HDL 45.  Hemoglobin 13.5, hematocrit 39.4, white count of 6.2.  His  TSH was 1.48.  Chest x-ray was negative.  Lexiscan Myoview showed normal study demonstrating no evidence of inducible myocardial ischemia.  Left ventricular ejection fraction of 75%.  BRIEF HOSPITAL COURSE:  The patient was admitted to telemetry unit.  MI was ruled out by serial enzymes and EKG.  The patient was started on Plavix and IV heparin, nitrates.  The patient subsequently underwent nuclear stress test as above.  The patient did not have any episodes of chest pain during the hospital stay.  The patient's LDL cholesterol has been significantly elevated.  His Pravachol has been switched to Lipitor and also the patient has been advised to take his clopidogrel daily for at least 6-12 months.  He will follow up with Dr. Algie Coffer in the next 7- 10 days.  The patient has been extensively counseled regarding diet and lifestyle modification, and compliance with medication.    Eduardo Osier. Sharyn Lull, M.D.    [facility]/MEDQ  D:  03/09/2013  T:  03/10/2013  Job:  [id number]

## 2013-11-11 ENCOUNTER — Encounter (HOSPITAL_COMMUNITY): Payer: Self-pay | Admitting: Emergency Medicine

## 2013-11-11 ENCOUNTER — Emergency Department (HOSPITAL_COMMUNITY): Payer: Self-pay

## 2013-11-11 ENCOUNTER — Observation Stay (HOSPITAL_COMMUNITY)
Admission: EM | Admit: 2013-11-11 | Discharge: 2013-11-12 | Disposition: A | Discharge status: Home / Self Care | Payer: Self-pay | Attending: Cardiovascular Disease | Admitting: Cardiovascular Disease

## 2013-11-11 DIAGNOSIS — Z7982 Long term (current) use of aspirin: Secondary | ICD-10-CM | POA: Insufficient documentation

## 2013-11-11 DIAGNOSIS — I10 Essential (primary) hypertension: Secondary | ICD-10-CM | POA: Insufficient documentation

## 2013-11-11 DIAGNOSIS — E7800 Pure hypercholesterolemia, unspecified: Secondary | ICD-10-CM | POA: Insufficient documentation

## 2013-11-11 DIAGNOSIS — Z79899 Other long term (current) drug therapy: Secondary | ICD-10-CM | POA: Insufficient documentation

## 2013-11-11 DIAGNOSIS — I200 Unstable angina: Secondary | ICD-10-CM | POA: Insufficient documentation

## 2013-11-11 DIAGNOSIS — I2510 Atherosclerotic heart disease of native coronary artery without angina pectoris: Principal | ICD-10-CM | POA: Insufficient documentation

## 2013-11-11 DIAGNOSIS — Z9861 Coronary angioplasty status: Secondary | ICD-10-CM | POA: Insufficient documentation

## 2013-11-11 DIAGNOSIS — R079 Chest pain, unspecified: Secondary | ICD-10-CM | POA: Diagnosis present

## 2013-11-11 DIAGNOSIS — Z7902 Long term (current) use of antithrombotics/antiplatelets: Secondary | ICD-10-CM | POA: Insufficient documentation

## 2013-11-11 LAB — CBC
HEMATOCRIT: 39.7 % (ref 39.0–52.0)
HEMOGLOBIN: 13.9 g/dL (ref 13.0–17.0)
MCH: 31.5 pg (ref 26.0–34.0)
MCHC: 35 g/dL (ref 30.0–36.0)
MCV: 90 fL (ref 78.0–100.0)
Platelets: 385 10*3/uL (ref 150–400)
RBC: 4.41 MIL/uL (ref 4.22–5.81)
RDW: 12.8 % (ref 11.5–15.5)
WBC: 7 10*3/uL (ref 4.0–10.5)

## 2013-11-11 LAB — BASIC METABOLIC PANEL
BUN: 23 mg/dL (ref 6–23)
CALCIUM: 10.1 mg/dL (ref 8.4–10.5)
CO2: 23 mEq/L (ref 19–32)
Chloride: 102 mEq/L (ref 96–112)
Creatinine, Ser: 1.41 mg/dL — ABNORMAL HIGH (ref 0.50–1.35)
GFR calc Af Amer: 59 mL/min — ABNORMAL LOW (ref >90)
GFR, EST NON AFRICAN AMERICAN: 51 mL/min — AB (ref >90)
Glucose, Bld: 104 mg/dL — ABNORMAL HIGH (ref 70–99)
Potassium: 4.3 mEq/L (ref 3.7–5.3)
SODIUM: 138 meq/L (ref 137–147)

## 2013-11-11 LAB — I-STAT TROPONIN, ED: TROPONIN I, POC: 0 ng/mL (ref 0.00–0.08)

## 2013-11-11 LAB — TROPONIN I
Troponin I: <0.3 ng/mL (ref <0.30)
Troponin I: <0.3 ng/mL (ref <0.30)

## 2013-11-11 LAB — HEPARIN LEVEL (UNFRACTIONATED): Heparin Unfractionated: 0.49 IU/mL (ref 0.30–0.70)

## 2013-11-11 MED ORDER — AMLODIPINE BESYLATE 2.5 MG PO TABS
2.5000 mg | ORAL_TABLET | Freq: Every day | ORAL | Status: DC
  Filled 2013-11-11 (×2): qty 1

## 2013-11-11 MED ORDER — ATORVASTATIN CALCIUM 80 MG PO TABS
80.0000 mg | ORAL_TABLET | Freq: Every day | ORAL | Status: DC
  Administered 2013-11-11: 80 mg via ORAL
  Filled 2013-11-11 (×3): qty 1

## 2013-11-11 MED ORDER — MORPHINE SULFATE 2 MG/ML IJ SOLN: 1.0000 mg | INTRAMUSCULAR | Status: DC | PRN

## 2013-11-11 MED ORDER — FAMOTIDINE 20 MG PO TABS
20.0000 mg | ORAL_TABLET | Freq: Two times a day (BID) | ORAL | Status: DC
  Administered 2013-11-11 – 2013-11-12 (×3): 20 mg via ORAL
  Filled 2013-11-11 (×4): qty 1

## 2013-11-11 MED ORDER — ASPIRIN EC 81 MG PO TBEC
81.0000 mg | DELAYED_RELEASE_TABLET | Freq: Every day | ORAL | Status: DC
  Administered 2013-11-11 – 2013-11-12 (×2): 81 mg via ORAL
  Filled 2013-11-11 (×2): qty 1

## 2013-11-11 MED ORDER — NITROGLYCERIN 0.4 MG SL SUBL
0.4000 mg | SUBLINGUAL_TABLET | SUBLINGUAL | Status: DC | PRN
  Administered 2013-11-11 (×2): 0.4 mg via SUBLINGUAL

## 2013-11-11 MED ORDER — ONDANSETRON HCL 4 MG/2ML IJ SOLN: 4.0000 mg | Freq: Four times a day (QID) | INTRAMUSCULAR | Status: DC | PRN

## 2013-11-11 MED ORDER — ISOSORBIDE MONONITRATE ER 60 MG PO TB24
60.0000 mg | ORAL_TABLET | Freq: Two times a day (BID) | ORAL | Status: DC
  Administered 2013-11-11 – 2013-11-12 (×2): 60 mg via ORAL
  Filled 2013-11-11 (×4): qty 1

## 2013-11-11 MED ORDER — LISINOPRIL 10 MG PO TABS
10.0000 mg | ORAL_TABLET | Freq: Every day | ORAL | Status: DC
  Administered 2013-11-11 – 2013-11-12 (×2): 10 mg via ORAL
  Filled 2013-11-11 (×2): qty 1

## 2013-11-11 MED ORDER — ALPRAZOLAM 0.25 MG PO TABS: 0.2500 mg | ORAL_TABLET | Freq: Two times a day (BID) | ORAL | Status: DC | PRN

## 2013-11-11 MED ORDER — HEPARIN (PORCINE) IN NACL 100-0.45 UNIT/ML-% IJ SOLN
1050.0000 [IU]/h | INTRAMUSCULAR | Status: DC
  Administered 2013-11-11: 1050 [IU]/h via INTRAVENOUS
  Filled 2013-11-11 (×3): qty 250

## 2013-11-11 MED ORDER — ACETAMINOPHEN 325 MG PO TABS: 650.0000 mg | ORAL_TABLET | ORAL | Status: DC | PRN

## 2013-11-11 MED ORDER — CLOPIDOGREL BISULFATE 75 MG PO TABS
75.0000 mg | ORAL_TABLET | Freq: Every day | ORAL | Status: DC
  Administered 2013-11-11 – 2013-11-12 (×2): 75 mg via ORAL
  Filled 2013-11-11 (×2): qty 1

## 2013-11-11 MED ORDER — HEPARIN BOLUS VIA INFUSION
4000.0000 [IU] | Freq: Once | INTRAVENOUS | Status: AC
  Administered 2013-11-11: 4000 [IU] via INTRAVENOUS
  Filled 2013-11-11: qty 4000

## 2013-11-11 MED ORDER — METOPROLOL TARTRATE 12.5 MG HALF TABLET
12.5000 mg | ORAL_TABLET | Freq: Two times a day (BID) | ORAL | Status: DC
  Administered 2013-11-11 – 2013-11-12 (×3): 12.5 mg via ORAL
  Filled 2013-11-11 (×4): qty 1

## 2013-11-11 NOTE — ED Provider Notes (Signed)
CSN: [id number]     Arrival date & time 11/11/13  0612 History   First MD Initiated Contact with Patient 11/11/13 [phone]     Chief Complaint  Patient presents with  . Chest Pain     (Consider location/radiation/quality/duration/timing/severity/associated sxs/prior Treatment) Patient is a 63 y.o. male presenting with chest pain. The history is provided by the patient. No language interpreter was used.  Chest Pain Pain location:  L chest Pain quality: aching and pressure   Pain radiates to:  Does not radiate Pain radiates to the back: no   Pain severity:  Moderate Associated symptoms: no abdominal pain, no back pain, no fever, no nausea, no shortness of breath and no weakness   Associated symptoms comment:  He woke this morning with left chest pain/pressure that persists. No SOB, nausea or vomiting. He reports a cardiac history with similar presentation, last catheterization 03/2013. He reports stents x 3 since 2000.    Past Medical History  Diagnosis Date  . Hypertension   . CAD (coronary artery disease)   . High cholesterol   . Anginal pain   . Gout    Past Surgical History  Procedure Laterality Date  . Cardiac catheterization  2000; 2007    "1 + 2" (02/04/2013)  . Coronary angioplasty  02/04/2013   No family history on file. History  Substance Use Topics  . Smoking status: Never Smoker   . Smokeless tobacco: Never Used  . Alcohol Use: 7.2 oz/week    12 Cans of beer per week     Comment: 02/04/2013 "couple beers, 4-5 times/wk; 12 pack/wk"    Review of Systems  Constitutional: Negative for fever.  Respiratory: Negative for shortness of breath.   Cardiovascular: Positive for chest pain. Negative for leg swelling.  Gastrointestinal: Negative for nausea and abdominal pain.  Musculoskeletal: Negative for back pain.  Neurological: Negative for weakness and light-headedness.      Allergies  Review of patient's allergies indicates no known allergies.  Home Medications    Prior to Admission medications   Medication Sig Start Date End Date Taking? Authorizing Provider  ALPRAZolam (XANAX) 0.25 MG tablet Take 0.25 mg by mouth 2 (two) times daily as needed for anxiety.    Historical Provider, MD  amLODipine (NORVASC) 2.5 MG tablet Take 2.5 mg by mouth daily.    Historical Provider, MD  aspirin EC 81 MG tablet Take 81 mg by mouth daily.    Historical Provider, MD  atorvastatin (LIPITOR) 80 MG tablet Take 1 tablet (80 mg total) by mouth daily at 6 PM. 03/09/13   Robynn PaneMohan N Harwani, MD  clopidogrel (PLAVIX) 75 MG tablet Take 1 tablet (75 mg total) by mouth daily with breakfast. 03/09/13   Robynn PaneMohan N Harwani, MD  famotidine (PEPCID) 20 MG tablet Take 1 tablet (20 mg total) by mouth 2 (two) times daily. 03/09/13   Robynn PaneMohan N Harwani, MD  isosorbide mononitrate (IMDUR) 60 MG 24 hr tablet Take 60 mg by mouth 2 (two) times daily.    Historical Provider, MD  lisinopril (PRINIVIL,ZESTRIL) 10 MG tablet Take 10 mg by mouth daily.     Historical Provider, MD  metoprolol tartrate (LOPRESSOR) 25 MG tablet Take 12.5 mg by mouth 2 (two) times daily.    Historical Provider, MD  nitroGLYCERIN (NITROSTAT) 0.4 MG SL tablet Place 0.4 mg under the tongue every 5 (five) minutes as needed for chest pain.    Historical Provider, MD  ranolazine (RANEXA) 500 MG 12 hr tablet Take 500 mg  by mouth 2 (two) times daily.    Historical Provider, MD   BP 144/83  Pulse 95  Temp(Src) 97.8 F (36.6 C) (Oral)  Resp 20  Ht 5\' 6"  (1.676 m)  Wt 170 lb (77.111 kg)  BMI 27.45 kg/m2  SpO2 99% Physical Exam  Constitutional: He is oriented to person, place, and time. He appears well-developed and well-nourished.  HENT:  Head: Normocephalic.  Neck: Normal range of motion. Neck supple.  Cardiovascular: Normal rate and regular rhythm.   No carotid bruit.  Pulmonary/Chest: Effort normal and breath sounds normal.  Abdominal: Soft. Bowel sounds are normal. There is no tenderness. There is no rebound and no guarding.   Musculoskeletal: Normal range of motion. He exhibits no edema.  Neurological: He is alert and oriented to person, place, and time.  Skin: Skin is warm and dry. No rash noted.  Psychiatric: He has a normal mood and affect.    ED Course  Procedures (including critical care time) Labs Review Labs Reviewed  CBC  BASIC METABOLIC PANEL  I-STAT TROPOININ, ED   Results for orders placed during the hospital encounter of 11/11/13  CBC      Result Value Ref Range   WBC 7.0  4.0 - 10.5 K/uL   RBC 4.41  4.22 - 5.81 MIL/uL   Hemoglobin 13.9  13.0 - 17.0 g/dL   HCT 94.8  01.6 - 55.3 %   MCV 90.0  78.0 - 100.0 fL   MCH 31.5  26.0 - 34.0 pg   MCHC 35.0  30.0 - 36.0 g/dL   RDW 74.8  27.0 - 78.6 %   Platelets 385  150 - 400 K/uL  BASIC METABOLIC PANEL      Result Value Ref Range   Sodium 138  137 - 147 mEq/L   Potassium 4.3  3.7 - 5.3 mEq/L   Chloride 102  96 - 112 mEq/L   CO2 23  19 - 32 mEq/L   Glucose, Bld 104 (*) 70 - 99 mg/dL   BUN 23  6 - 23 mg/dL   Creatinine, Ser 7.54 (*) 0.50 - 1.35 mg/dL   Calcium 49.2  8.4 - 01.0 mg/dL   GFR calc non Af Amer 51 (*) >90 mL/min   GFR calc Af Amer 59 (*) >90 mL/min  I-STAT TROPOININ, ED      Result Value Ref Range   Troponin i, poc 0.00  0.00 - 0.08 ng/mL   Comment 3              Imaging Review No results found.   EKG Interpretation   Date/Time:  Wednesday November 11 2013 06:15:55 EDT Ventricular Rate:  93 PR Interval:  158 QRS Duration: 84 QT Interval:  356 QTC Calculation: 442 R Axis:   -53 Text Interpretation:  Normal sinus rhythm Left anterior fascicular block  Abnormal ECG No significant change since last tracing Confirmed by  Bebe Shaggy  MD, DONALD ([id number]) on 11/11/2013 6:25:01 AM      MDM   Final diagnoses:  None    1. Chest pain  Pain is decreased with NTG x 2 so far - he rates discomfort now around a 1-2/10, down from 5/10.  Discussed the patient's presentation with Dr. Algie Coffer who will see the patient in the ED and  evaluate for admission.    Arnoldo Hooker, PA-C 11/11/13 [phone]

## 2013-11-11 NOTE — ED Notes (Signed)
Patient states he woke up with chest pain.  He has hx of stents.  He is seen by Dr Wylie Hail.  Patient did not take any nitro.  Patient did take aspirin 81mg  prior to coming to ED.  Patient denies sob, dizziness.  Patient states he has squeezing chest pain.

## 2013-11-11 NOTE — ED Notes (Signed)
Patient last angioplasty was august 2014

## 2013-11-11 NOTE — Progress Notes (Signed)
ANTICOAGULATION CONSULT NOTE - Follow Up Consult    HL = 0.49 (goal 0.3 - 0.7 units/mL) Heparin dosing weight = 77 kg   Assessment: 62 YOM with ACS to continue on IV heparin.  Heparin level therapeutic.  No bleeding reported.   Plan: - Continue heparin gtt at 1050 units/hr - F/U AM labs    Patrisia Faeth D. Laney Potash, PharmD, BCPS Pager:  [phone] 11/11/2013, 7:09 PM

## 2013-11-11 NOTE — ED Provider Notes (Signed)
Patient seen/examined in the Emergency Department in conjunction with Midlevel Provider [place] Patient reports chest pain with h/o CAD  Exam : awake/alert, no distress, no murmurs noted on cardiac exam Plan: likely admit to dr Algie Coffer   EKG Interpretation  Date/Time:  Wednesday November 11 2013 06:15:55 EDT Ventricular Rate:  93 PR Interval:  158 QRS Duration: 84 QT Interval:  356 QTC Calculation: 442 R Axis:   -53 Text Interpretation:  Normal sinus rhythm Left anterior fascicular block Abnormal ECG No significant change since last tracing Confirmed by Bebe Shaggy  MD, Malissie Musgrave ([id number]) on 11/11/2013 6:25:01 AM        Joya Gaskins, MD 11/11/13 0700

## 2013-11-11 NOTE — ED Notes (Signed)
Report from Tina, NT 

## 2013-11-11 NOTE — ED Provider Notes (Signed)
Medical screening examination/treatment/procedure(s) were conducted as a shared visit with non-physician practitioner(s) and myself.  I personally evaluated the patient during the encounter.   EKG Interpretation   Date/Time:  Wednesday November 11 2013 06:15:55 EDT Ventricular Rate:  93 PR Interval:  158 QRS Duration: 84 QT Interval:  356 QTC Calculation: 442 R Axis:   -53 Text Interpretation:  Normal sinus rhythm Left anterior fascicular block  Abnormal ECG No significant change since last tracing Confirmed by  Bebe Shaggy  MD, Brave Dack ([id number]) on 11/11/2013 6:25:01 AM        Joya Gaskins, MD 11/11/13 [phone]

## 2013-11-11 NOTE — Progress Notes (Signed)
ANTICOAGULATION CONSULT NOTE - Initial Consult  Pharmacy Consult for Heparin Indication: chest pain/ACS  No Known Allergies  Patient Measurements: Height: 5\' 6"  (167.6 cm) Weight: 170 lb (77.111 kg) IBW/kg (Calculated) : 63.8 Heparin Dosing Weight: 77.1 kg  Vital Signs: Temp: 97.8 F (36.6 C) (06/03 0618) Temp src: Oral (06/03 0618) BP: 136/81 mmHg (06/03 0715) Pulse Rate: 91 (06/03 0715)  Labs:  Recent Labs  11/11/13 0637  HGB 13.9  HCT 39.7  PLT 385  CREATININE 1.41*    Estimated Creatinine Clearance: 51.7 ml/min (by C-G formula based on Cr of 1.41).   Medical History: Past Medical History  Diagnosis Date  . Hypertension   . CAD (coronary artery disease)   . High cholesterol   . Anginal pain   . Gout     Medications:   see med rec  Assessment: 63 y/o M with know CAD and stents awoke with squeezing CP. PMH significant for CAD, HTN, HLD, gout. Dr. Algie Coffer will admit.  Labs: Scr 1.41 (at baseline). Hgb 13.9, Plts 385, glucose 104  Goal of Therapy:  Heparin level 0.3-0.7 units/ml Monitor platelets by anticoagulation protocol: Yes   Plan:  Heparin 4000 unit IV bolus Heparin infusion at 1050 units/hr. Check heparin level in 8 hrs Heparin level and CBC daily.  Brandace Cargle [employer] 11/11/2013,8:23 AM

## 2013-11-11 NOTE — H&P (Signed)
Referring Physician:  [name] is an 63 y.o. male.                       Chief Complaint: Chest pain HPI: 63 years old male with one day history of recurrent chest pain which is left sided, dull, non-radiating, mostly relieved by NTG and associated with diaphoresis and not with cough or shortness of breath. He ran out of SL NTG. In ER he responded to 2 SL NTG. Chest pressure is now down to 2 out of 10.    Past Medical History  Diagnosis Date  . Hypertension   . CAD (coronary artery disease)   . High cholesterol   . Anginal pain   . Gout       Past Surgical History  Procedure Laterality Date  . Cardiac catheterization  2000; 2007    "1 + 2" (02/04/2013)  . Coronary angioplasty  02/04/2013    No family history on file. Social History:  reports that he has never smoked. He has never used smokeless tobacco. He reports that he drinks about 7.2 ounces of alcohol per week. He reports that he does not use illicit drugs.  Allergies: No Known Allergies   (Not in a hospital admission)  Results for orders placed during the hospital encounter of 11/11/13 (from the past 48 hour(s))  CBC     Status: None   Collection Time    11/11/13  6:37 AM      Result Value Ref Range   WBC 7.0  4.0 - 10.5 K/uL   RBC 4.41  4.22 - 5.81 MIL/uL   Hemoglobin 13.9  13.0 - 17.0 g/dL   HCT 39.7  39.0 - 52.0 %   MCV 90.0  78.0 - 100.0 fL   MCH 31.5  26.0 - 34.0 pg   MCHC 35.0  30.0 - 36.0 g/dL   RDW 12.8  11.5 - 15.5 %   Platelets 385  150 - 400 K/uL  BASIC METABOLIC PANEL     Status: Abnormal   Collection Time    11/11/13  6:37 AM      Result Value Ref Range   Sodium 138  137 - 147 mEq/L   Potassium 4.3  3.7 - 5.3 mEq/L   Chloride 102  96 - 112 mEq/L   CO2 23  19 - 32 mEq/L   Glucose, Bld 104 (*) 70 - 99 mg/dL   BUN 23  6 - 23 mg/dL   Creatinine, Ser 1.41 (*) 0.50 - 1.35 mg/dL   Calcium 10.1  8.4 - 10.5 mg/dL   GFR calc non Af Amer 51 (*) >90 mL/min   GFR calc Af Amer 59 (*) >90 mL/min    Comment: (NOTE)     The eGFR has been calculated using the CKD EPI equation.     This calculation has not been validated in all clinical situations.     eGFR's persistently <90 mL/min signify possible Chronic Kidney     Disease.  [name], ED     Status: None   Collection Time    11/11/13  6:46 AM      Result Value Ref Range   Troponin i, poc 0.00  0.00 - 0.08 ng/mL   Comment 3            Comment: Due to the release kinetics of cTnI,     a negative result within the first hours     of the onset  of symptoms does not rule out     myocardial infarction with certainty.     If myocardial infarction is still suspected,     repeat the test at appropriate intervals.   Dg Chest Portable 1 View  11/11/2013   CLINICAL DATA:  Chest pain.  EXAM: PORTABLE CHEST - 1 VIEW  COMPARISON:  Chest radiograph March 08, 2013  FINDINGS: Cardiac silhouette remains mildly enlarged, mediastinal silhouette is nonsuspicious. No pleural effusions or focal consolidations. No pneumothorax. Soft tissue planes and included osseous structures are nonsuspicious. Multiple EKG lines overlie the patient and may obscure subtle underlying pathology. Mild degenerative change of the thoracic spine.  IMPRESSION: Stable mild cardiomegaly without acute pulmonary process.   Electronically Signed   By: [name]   On: 11/11/2013 06:46    Review Of Systems Denies recent weight gain or weight loss. Wears glasses. Has a left eye injury 25 years ago. Has recurrent chest pain. No history of asthma, COPD, palpitations, leg swelling, GI bleed, hepatitis, blood transfusion, kidney stone, stroke, seizures or psychiatric admissions.    Blood pressure 136/81, pulse 91, temperature 97.8 F (36.6 C), temperature source Oral, resp. rate 18, height 5' 6"  (1.676 m), weight 77.111 kg (170 lb), SpO2 95.00%. Physical Exam: GENERAL: Patient is alert and oriented x 3.  HEENT: Patient is normocephalic, atraumatic. Extraocular movement  intact. Conjunctivae are pink. Sclerae are non-icteric. He has unequal pupils, but reacting to light.  NECK: No JVD. No carotid bruit. No thyromegaly. Full range of motion of the neck.  LUNGS: Clear bilaterally.  HEART: Normal S1, S2 with a grade II/VI systolic murmur.  ABDOMEN: Soft and nontender.  EXTREMITIES: No edema, cyanosis or clubbing.  CNS: Cranial nerves grossly intact. Patient moves all 4 extremities.  SKIN: Warm and dry  Assessment/Plan Unstable angina CAD S/P stent in LAD/Diagonal  Start IV Heparin and home medications. Cardiac enzymes in 4 hours. Medical treatment on OP v/s IP  [name] 11/11/2013, 8:21 AM

## 2013-11-12 ENCOUNTER — Encounter (HOSPITAL_COMMUNITY)
Admission: EM | Disposition: A | Discharge status: Home / Self Care | Payer: Self-pay | Source: Home / Self Care | Attending: Emergency Medicine

## 2013-11-12 HISTORY — PX: LEFT HEART CATHETERIZATION WITH CORONARY ANGIOGRAM: SHX5451

## 2013-11-12 LAB — LIPID PANEL
CHOL/HDL RATIO: 4.2 ratio
Cholesterol: 191 mg/dL (ref 0–200)
HDL: 46 mg/dL (ref >39)
LDL Cholesterol: 116 mg/dL — ABNORMAL HIGH (ref 0–99)
Triglycerides: 144 mg/dL (ref <150)
VLDL: 29 mg/dL (ref 0–40)

## 2013-11-12 LAB — BASIC METABOLIC PANEL WITH GFR
BUN: 22 mg/dL (ref 6–23)
CO2: 23 meq/L (ref 19–32)
Calcium: 9.3 mg/dL (ref 8.4–10.5)
Chloride: 101 meq/L (ref 96–112)
Creatinine, Ser: 1.27 mg/dL (ref 0.50–1.35)
GFR calc Af Amer: 67 mL/min — ABNORMAL LOW
GFR calc non Af Amer: 58 mL/min — ABNORMAL LOW
Glucose, Bld: 98 mg/dL (ref 70–99)
Potassium: 4.4 meq/L (ref 3.7–5.3)
Sodium: 138 meq/L (ref 137–147)

## 2013-11-12 LAB — CBC
HCT: 38.1 % — ABNORMAL LOW (ref 39.0–52.0)
Hemoglobin: 13.3 g/dL (ref 13.0–17.0)
MCH: 31.8 pg (ref 26.0–34.0)
MCHC: 34.9 g/dL (ref 30.0–36.0)
MCV: 91.1 fL (ref 78.0–100.0)
PLATELETS: 376 10*3/uL (ref 150–400)
RBC: 4.18 MIL/uL — AB (ref 4.22–5.81)
RDW: 13.1 % (ref 11.5–15.5)
WBC: 8.4 10*3/uL (ref 4.0–10.5)

## 2013-11-12 LAB — PROTIME-INR
INR: 0.99 (ref 0.00–1.49)
Prothrombin Time: 12.9 seconds (ref 11.6–15.2)

## 2013-11-12 LAB — HEPARIN LEVEL (UNFRACTIONATED): Heparin Unfractionated: 0.33 [IU]/mL (ref 0.30–0.70)

## 2013-11-12 LAB — POCT ACTIVATED CLOTTING TIME: ACTIVATED CLOTTING TIME: 93 s

## 2013-11-12 SURGERY — LEFT HEART CATHETERIZATION WITH CORONARY ANGIOGRAM
Anesthesia: LOCAL

## 2013-11-12 MED ORDER — HEPARIN (PORCINE) IN NACL 2-0.9 UNIT/ML-% IJ SOLN
INTRAMUSCULAR | Status: AC
  Filled 2013-11-12: qty 1500

## 2013-11-12 MED ORDER — NITROGLYCERIN 0.4 MG SL SUBL: 0.4000 mg | SUBLINGUAL_TABLET | SUBLINGUAL | Status: DC | PRN

## 2013-11-12 MED ORDER — SODIUM CHLORIDE 0.9 % IV SOLN
INTRAVENOUS | Status: AC
  Administered 2013-11-12: 12:00:00 via INTRAVENOUS

## 2013-11-12 MED ORDER — NITROGLYCERIN 0.2 MG/ML ON CALL CATH LAB
INTRAVENOUS | Status: AC
  Filled 2013-11-12: qty 1

## 2013-11-12 MED ORDER — SODIUM CHLORIDE 0.9 % IV SOLN: 250.0000 mL | INTRAVENOUS | Status: DC | PRN

## 2013-11-12 MED ORDER — SODIUM CHLORIDE 0.9 % IJ SOLN: 3.0000 mL | Freq: Two times a day (BID) | INTRAMUSCULAR | Status: DC

## 2013-11-12 MED ORDER — MIDAZOLAM HCL 2 MG/2ML IJ SOLN
INTRAMUSCULAR | Status: AC
  Filled 2013-11-12: qty 2

## 2013-11-12 MED ORDER — METOPROLOL TARTRATE 25 MG PO TABS: 12.5000 mg | ORAL_TABLET | Freq: Three times a day (TID) | ORAL | Status: DC

## 2013-11-12 MED ORDER — SODIUM CHLORIDE 0.9 % IV SOLN
INTRAVENOUS | Status: DC
  Administered 2013-11-12: 08:00:00 via INTRAVENOUS

## 2013-11-12 MED ORDER — FENTANYL CITRATE 0.05 MG/ML IJ SOLN
INTRAMUSCULAR | Status: AC
  Filled 2013-11-12: qty 2

## 2013-11-12 MED ORDER — LIDOCAINE HCL (PF) 1 % IJ SOLN
INTRAMUSCULAR | Status: AC
  Filled 2013-11-12: qty 30

## 2013-11-12 MED ORDER — SODIUM CHLORIDE 0.9 % IJ SOLN: 3.0000 mL | INTRAMUSCULAR | Status: DC | PRN

## 2013-11-12 NOTE — Progress Notes (Signed)
UR completed 

## 2013-11-12 NOTE — Interval H&P Note (Signed)
History and Physical Interval Note:  11/12/2013 10:13 AM  [name]  has presented today for surgery, with the diagnosis of cp  The various methods of treatment have been discussed with the patient and family. After consideration of risks, benefits and other options for treatment, the patient has consented to  Procedure(s): LEFT HEART CATHETERIZATION WITH CORONARY ANGIOGRAM (N/A) as a surgical intervention .  The patient's history has been reviewed, patient examined, no change in status, stable for surgery.  I have reviewed the patient's chart and labs.  Questions were answered to the patient's satisfaction.     [name]

## 2013-11-12 NOTE — CV Procedure (Signed)
PROCEDURE:  Left heart catheterization with selective coronary angiography, left ventriculogram.  CLINICAL HISTORY:  This is a 63 year old male with known CAD and stent has recurrent chest pain.  The risks, benefits, and details of the procedure were explained to the patient.  The patient verbalized understanding and wanted to proceed.  Informed written consent was obtained.  PROCEDURE TECHNIQUE:  The patient was approached from the right femoral artery using a 5 French short sheath.  Left coronary angiography was done using a Judkins L4 guide catheter.  Right coronary angiography was done using a Judkins R4 guide catheter.  Left ventriculography was done using a pigtail catheter.    CONTRAST:  Total of 70 cc.  COMPLICATIONS:  None.  At the end of the procedure a manual pressure device was used for hemostasis.    HEMODYNAMICS:  Aortic pressure was 119/64; LV pressure was 122/0; LVEDP 5.  There was no gradient between the left ventricle and aorta.    ANGIOGRAM/CORONARY ARTERIOGRAM:   The left main coronary artery is unremarkable.  The left anterior descending artery has 20 % narrowing near ostium and pre stent area. Mid vessel stent is patent with good distal flow. Diagonal has osteal and pre stent 70 % concentric narrowing.  The left circumflex artery is unremarkable. OMs are unremarkable.  The right coronary artery is dominant and without any significant narrowing. Long PDA wraps around apex of the heart and is unremarkable.Marland Kitchen  LEFT VENTRICULOGRAM:  Left ventricular angiogram was done in the 30 RAO projection and revealed normal left ventricular wall motion and systolic function with an estimated ejection fraction of 65%.  LVEDP was 5 mmHg.  IMPRESSION OF HEART CATHETERIZATION:   1. Normal left main coronary artery. 2. Mild disease of  left anterior descending artery and moderate disease of diagonal vessel. Stents in both vessels are patent with good distal flow. 3. Normal left circumflex  artery and its branches. 4. Normal and dominant right coronary artery. 5. Normal left ventricular systolic function.  LVEDP 5 mmHg.  Ejection fraction 65%.  RECOMMENDATION:   Maximize medical treatment. Cine reviewed by Dr. Rinaldo Cloud.

## 2013-11-12 NOTE — Discharge Summary (Signed)
Physician Discharge Summary  Patient ID: [name] MRN: [id number] DOB/AGE: 65/10/1950 65 y.o.  Admit date: 11/11/2013 Discharge date: 11/12/2013  Admission Diagnoses: Unstable angina  CAD  S/P stent in LAD/Diagonal  Discharge Diagnoses:  Principle Problem: * Unstable angina * CAD, Multivessel, native vessel  S/P stent in LAD/Diagonal   Discharged Condition: fair  Hospital Course: 63 years old male with one day history of recurrent chest pain which is left sided, dull, non-radiating, mostly relieved by NTG and associated with diaphoresis and not with cough or shortness of breath. He underwent cardiac catheterization that showed patent stents in LAD and diagonal but osteal diagonal at-least a moderate disease. Patient was advised to maximize medical therapy and consider angioplasty if symptoms return. His small B-blocker dose was increased by 50 %. He will be followed in one week.  Consults: cardiology  Significant Diagnostic Studies: labs: Normal CBC and BMET except borderline elevated creatinine of 1.41. Normal cardiac enzymes  EKG-SR, low voltage.  Cardiac cath showed patent stents in LAD and diagonal but osteal diagonal at-least a moderate disease. Normal LV systolic function.  Treatments: cardiac meds: lisinopril (Zestril), metoprolol, amlodipine and Imdur and anticoagulation: ASA, Plavix and heparin  Discharge Exam: Blood pressure 103/61, pulse 67, temperature 98 F (36.7 C), temperature source Oral, resp. rate 20, height 5\' 6"  (1.676 m), weight 76.999 kg (169 lb 12 oz), SpO2 97.00%. GENERAL: Patient is alert and oriented x 3.  HEENT: Patient is normocephalic, atraumatic. Extraocular movement intact. Conjunctivae are pink. Sclerae are non-icteric. He has unequal pupils, but reacting to light.  NECK: No JVD. No carotid bruit. No thyromegaly. Full range of motion of the neck.  LUNGS: Clear bilaterally.  HEART: Normal S1, S2 with a grade II/VI systolic murmur.  ABDOMEN:  Soft and nontender.  EXTREMITIES: No edema, cyanosis or clubbing.  CNS: Cranial nerves grossly intact. Patient moves all 4 extremities.  SKIN: Warm and dry   Disposition: 01-Home or Self Care     Medication List         ALPRAZolam 0.25 MG tablet  Commonly known as:  XANAX  Take 0.25 mg by mouth 2 (two) times daily as needed for anxiety.     amLODipine 2.5 MG tablet  Commonly known as:  NORVASC  Take 2.5 mg by mouth daily.     aspirin EC 81 MG tablet  Take 81 mg by mouth daily.     atorvastatin 80 MG tablet  Commonly known as:  LIPITOR  Take 1 tablet (80 mg total) by mouth daily at 6 PM.     clopidogrel 75 MG tablet  Commonly known as:  PLAVIX  Take 1 tablet (75 mg total) by mouth daily with breakfast.     famotidine 20 MG tablet  Commonly known as:  PEPCID  Take 1 tablet (20 mg total) by mouth 2 (two) times daily.     isosorbide mononitrate 60 MG 24 hr tablet  Commonly known as:  IMDUR  Take 60 mg by mouth 2 (two) times daily.     lisinopril 10 MG tablet  Commonly known as:  PRINIVIL,ZESTRIL  Take 10 mg by mouth daily.     metoprolol tartrate 25 MG tablet  Commonly known as:  LOPRESSOR  Take 0.5 tablets (12.5 mg total) by mouth 3 (three) times daily.     nitroGLYCERIN 0.4 MG SL tablet  Commonly known as:  NITROSTAT  Place 1 tablet (0.4 mg total) under the tongue every 5 (five) minutes as needed for chest  pain.           Follow-up Information   Follow up with [facility] S, MD. Schedule an appointment as soon as possible for a visit in 1 week.   Specialty:  Cardiology   Contact information:   [address] [place] [address] [phone]       Signed: [name] 11/12/2013, 6:28 PM

## 2013-11-12 NOTE — Progress Notes (Signed)
Pt discharged to home per MD order. Pt received and reviewed all discharge instructions and medication information including follow-up appointments and prescriptions. Pt verbalized understanding. Pt IV and telemetry box removed prior to discharge. Pt alert and oriented at discharge with no complaints of pain. Pt ambulated to private vehicle per pt request. Joylene Grapes

## 2014-05-20 ENCOUNTER — Encounter (HOSPITAL_COMMUNITY): Payer: Self-pay | Admitting: Cardiovascular Disease

## 2015-03-04 ENCOUNTER — Emergency Department (HOSPITAL_COMMUNITY): Payer: Medicare Other

## 2015-03-04 ENCOUNTER — Observation Stay (HOSPITAL_COMMUNITY)
Admission: EM | Admit: 2015-03-04 | Discharge: 2015-03-05 | Disposition: A | Discharge status: Home / Self Care | Payer: Medicare Other | Attending: Cardiovascular Disease | Admitting: Cardiovascular Disease

## 2015-03-04 ENCOUNTER — Encounter (HOSPITAL_COMMUNITY): Payer: Self-pay | Admitting: Cardiology

## 2015-03-04 DIAGNOSIS — R079 Chest pain, unspecified: Principal | ICD-10-CM | POA: Insufficient documentation

## 2015-03-04 DIAGNOSIS — M109 Gout, unspecified: Secondary | ICD-10-CM | POA: Diagnosis not present

## 2015-03-04 DIAGNOSIS — Z79899 Other long term (current) drug therapy: Secondary | ICD-10-CM | POA: Diagnosis not present

## 2015-03-04 DIAGNOSIS — Z7982 Long term (current) use of aspirin: Secondary | ICD-10-CM | POA: Insufficient documentation

## 2015-03-04 DIAGNOSIS — Z9861 Coronary angioplasty status: Secondary | ICD-10-CM | POA: Diagnosis not present

## 2015-03-04 DIAGNOSIS — I10 Essential (primary) hypertension: Secondary | ICD-10-CM | POA: Insufficient documentation

## 2015-03-04 DIAGNOSIS — Z7902 Long term (current) use of antithrombotics/antiplatelets: Secondary | ICD-10-CM | POA: Diagnosis not present

## 2015-03-04 DIAGNOSIS — E780 Pure hypercholesterolemia: Secondary | ICD-10-CM | POA: Diagnosis not present

## 2015-03-04 DIAGNOSIS — I249 Acute ischemic heart disease, unspecified: Secondary | ICD-10-CM | POA: Diagnosis present

## 2015-03-04 DIAGNOSIS — I25119 Atherosclerotic heart disease of native coronary artery with unspecified angina pectoris: Secondary | ICD-10-CM | POA: Diagnosis not present

## 2015-03-04 LAB — BASIC METABOLIC PANEL
ANION GAP: 7 (ref 5–15)
BUN: 17 mg/dL (ref 6–20)
CO2: 25 mmol/L (ref 22–32)
CREATININE: 1.35 mg/dL — AB (ref 0.61–1.24)
Calcium: 9.5 mg/dL (ref 8.9–10.3)
Chloride: 105 mmol/L (ref 101–111)
GFR calc non Af Amer: 54 mL/min — ABNORMAL LOW (ref >60)
Glucose, Bld: 110 mg/dL — ABNORMAL HIGH (ref 65–99)
Potassium: 4.2 mmol/L (ref 3.5–5.1)
SODIUM: 137 mmol/L (ref 135–145)

## 2015-03-04 LAB — CBC
HCT: 44.1 % (ref 39.0–52.0)
HEMOGLOBIN: 15 g/dL (ref 13.0–17.0)
MCH: 31.4 pg (ref 26.0–34.0)
MCHC: 34 g/dL (ref 30.0–36.0)
MCV: 92.5 fL (ref 78.0–100.0)
PLATELETS: 315 10*3/uL (ref 150–400)
RBC: 4.77 MIL/uL (ref 4.22–5.81)
RDW: 13 % (ref 11.5–15.5)
WBC: 9.1 10*3/uL (ref 4.0–10.5)

## 2015-03-04 LAB — I-STAT TROPONIN, ED: TROPONIN I, POC: 0 ng/mL (ref 0.00–0.08)

## 2015-03-04 LAB — TROPONIN I
Troponin I: <0.03 ng/mL (ref <0.031)
Troponin I: <0.03 ng/mL (ref <0.031)

## 2015-03-04 LAB — HEPARIN LEVEL (UNFRACTIONATED): Heparin Unfractionated: 0.65 IU/mL (ref 0.30–0.70)

## 2015-03-04 MED ORDER — SODIUM CHLORIDE 0.9 % IJ SOLN: 3.0000 mL | INTRAMUSCULAR | Status: DC | PRN

## 2015-03-04 MED ORDER — FAMOTIDINE 20 MG PO TABS
20.0000 mg | ORAL_TABLET | Freq: Two times a day (BID) | ORAL | Status: DC
  Administered 2015-03-04 – 2015-03-05 (×2): 20 mg via ORAL
  Filled 2015-03-04 (×2): qty 1

## 2015-03-04 MED ORDER — ASPIRIN 300 MG RE SUPP: 300.0000 mg | RECTAL | Status: AC

## 2015-03-04 MED ORDER — ASPIRIN EC 81 MG PO TBEC
81.0000 mg | DELAYED_RELEASE_TABLET | Freq: Every day | ORAL | Status: DC
  Administered 2015-03-05: 81 mg via ORAL
  Filled 2015-03-04: qty 1

## 2015-03-04 MED ORDER — SODIUM CHLORIDE 0.9 % IJ SOLN
3.0000 mL | Freq: Two times a day (BID) | INTRAMUSCULAR | Status: DC
  Administered 2015-03-05: 3 mL via INTRAVENOUS

## 2015-03-04 MED ORDER — ASPIRIN 81 MG PO CHEW: 324.0000 mg | CHEWABLE_TABLET | ORAL | Status: AC

## 2015-03-04 MED ORDER — CLOPIDOGREL BISULFATE 75 MG PO TABS
75.0000 mg | ORAL_TABLET | Freq: Every day | ORAL | Status: DC
  Administered 2015-03-05: 75 mg via ORAL
  Filled 2015-03-04: qty 1

## 2015-03-04 MED ORDER — PRAVASTATIN SODIUM 40 MG PO TABS
40.0000 mg | ORAL_TABLET | Freq: Two times a day (BID) | ORAL | Status: DC
  Administered 2015-03-04 – 2015-03-05 (×2): 40 mg via ORAL
  Filled 2015-03-04 (×3): qty 1

## 2015-03-04 MED ORDER — LISINOPRIL 10 MG PO TABS
10.0000 mg | ORAL_TABLET | Freq: Every day | ORAL | Status: DC
  Administered 2015-03-05: 10 mg via ORAL
  Filled 2015-03-04: qty 1

## 2015-03-04 MED ORDER — HEPARIN BOLUS VIA INFUSION
4000.0000 [IU] | Freq: Once | INTRAVENOUS | Status: AC
  Administered 2015-03-04: 4000 [IU] via INTRAVENOUS
  Filled 2015-03-04: qty 4000

## 2015-03-04 MED ORDER — ALPRAZOLAM 0.25 MG PO TABS: 0.2500 mg | ORAL_TABLET | Freq: Two times a day (BID) | ORAL | Status: DC | PRN

## 2015-03-04 MED ORDER — METOPROLOL TARTRATE 12.5 MG HALF TABLET
12.5000 mg | ORAL_TABLET | Freq: Two times a day (BID) | ORAL | Status: DC
  Administered 2015-03-04 – 2015-03-05 (×2): 12.5 mg via ORAL
  Filled 2015-03-04 (×2): qty 1

## 2015-03-04 MED ORDER — ISOSORBIDE MONONITRATE ER 60 MG PO TB24
60.0000 mg | ORAL_TABLET | Freq: Two times a day (BID) | ORAL | Status: DC
  Administered 2015-03-04 – 2015-03-05 (×2): 60 mg via ORAL
  Filled 2015-03-04 (×2): qty 1

## 2015-03-04 MED ORDER — AMLODIPINE BESYLATE 2.5 MG PO TABS
2.5000 mg | ORAL_TABLET | Freq: Every day | ORAL | Status: DC
  Administered 2015-03-04: 2.5 mg via ORAL
  Filled 2015-03-04 (×2): qty 1

## 2015-03-04 MED ORDER — ONDANSETRON HCL 4 MG/2ML IJ SOLN: 4.0000 mg | Freq: Four times a day (QID) | INTRAMUSCULAR | Status: DC | PRN

## 2015-03-04 MED ORDER — SODIUM CHLORIDE 0.9 % IV SOLN: 250.0000 mL | INTRAVENOUS | Status: DC | PRN

## 2015-03-04 MED ORDER — MORPHINE SULFATE (PF) 2 MG/ML IV SOLN: 1.0000 mg | Freq: Once | INTRAVENOUS | Status: DC

## 2015-03-04 MED ORDER — ACETAMINOPHEN 325 MG PO TABS: 650.0000 mg | ORAL_TABLET | ORAL | Status: DC | PRN

## 2015-03-04 MED ORDER — HEPARIN (PORCINE) IN NACL 100-0.45 UNIT/ML-% IJ SOLN
1050.0000 [IU]/h | INTRAMUSCULAR | Status: DC
  Administered 2015-03-04: 1050 [IU]/h via INTRAVENOUS
  Filled 2015-03-04: qty 250

## 2015-03-04 MED ORDER — NITROGLYCERIN 0.4 MG SL SUBL
0.4000 mg | SUBLINGUAL_TABLET | SUBLINGUAL | Status: DC | PRN
  Administered 2015-03-04 (×2): 0.4 mg via SUBLINGUAL
  Filled 2015-03-04: qty 1

## 2015-03-04 MED ORDER — HEPARIN (PORCINE) IN NACL 100-0.45 UNIT/ML-% IJ SOLN
950.0000 [IU]/h | INTRAMUSCULAR | Status: DC
  Administered 2015-03-05: 950 [IU]/h via INTRAVENOUS
  Filled 2015-03-04: qty 250

## 2015-03-04 NOTE — ED Provider Notes (Signed)
CSN: [id number]     Arrival date & time 03/04/15  7829 History   First MD Initiated Contact with Patient 03/04/15 [id number]     Chief Complaint  Patient presents with  . Chest Pain     (Consider location/radiation/quality/duration/timing/severity/associated sxs/prior Treatment) HPI Comments: Patient here complaining of left-sided chest pain that began this morning when he was shaving. Pain is similar to his anginal equivalent and he does have a history of coronary artery stents 2. Pain did not radiate to his neck or to his arm. It was not associated with shortness of breath or diaphoresis. No recent cough or cold symptoms. Pain has been persistent and is not worse with exertion. Denies any leg pain or swelling. Presents now for further evaluation  Patient is a 64 y.o. male presenting with chest pain. The history is provided by the patient.  Chest Pain   Past Medical History  Diagnosis Date  . Hypertension   . CAD (coronary artery disease)   . High cholesterol   . Anginal pain   . Gout    Past Surgical History  Procedure Laterality Date  . Cardiac catheterization  2000; 2007    "1 + 2" (02/04/2013)  . Coronary angioplasty  02/04/2013  . Left heart catheterization with coronary angiogram N/A 02/04/2013    Procedure: LEFT HEART CATHETERIZATION WITH CORONARY ANGIOGRAM;  Surgeon: [name], MD;  Location: [facility];  Service: Cardiovascular;  Laterality: N/A;  . Percutaneous coronary intervention-balloon only  02/04/2013    Procedure: PERCUTANEOUS CORONARY INTERVENTION-BALLOON ONLY;  Surgeon: [name], MD;  Location: [facility];  Service: Cardiovascular;;  . Left heart catheterization with coronary angiogram N/A 02/11/2013    Procedure: LEFT HEART CATHETERIZATION WITH CORONARY ANGIOGRAM;  Surgeon: [name], MD;  Location: [facility];  Service: Cardiovascular;  Laterality: N/A;  . Left heart catheterization with coronary angiogram N/A 11/12/2013    Procedure: LEFT HEART  CATHETERIZATION WITH CORONARY ANGIOGRAM;  Surgeon: [name], MD;  Location: [facility];  Service: Cardiovascular;  Laterality: N/A;   History reviewed. No pertinent family history. Social History  Substance Use Topics  . Smoking status: Never Smoker   . Smokeless tobacco: Never Used  . Alcohol Use: 7.2 oz/week    12 Cans of beer per week     Comment: 02/04/2013 "couple beers, 4-5 times/wk; 12 pack/wk"    Review of Systems  Cardiovascular: Positive for chest pain.  All other systems reviewed and are negative.     Allergies  Review of patient's allergies indicates no known allergies.  Home Medications   Prior to Admission medications   Medication Sig Start Date End Date Taking? Authorizing Arantxa Piercey  ALPRAZolam (XANAX) 0.25 MG tablet Take 0.25 mg by mouth 2 (two) times daily as needed for anxiety.   Yes Historical Hason Ofarrell, MD  amLODipine (NORVASC) 2.5 MG tablet Take 2.5 mg by mouth daily.   Yes Historical Seanne Chirico, MD  aspirin EC 81 MG tablet Take 81 mg by mouth daily.   Yes Historical Smantha Boakye, MD  clopidogrel (PLAVIX) 75 MG tablet Take 1 tablet (75 mg total) by mouth daily with breakfast. 03/09/13  Yes Rinaldo Cloud, MD  famotidine (PEPCID) 20 MG tablet Take 1 tablet (20 mg total) by mouth 2 (two) times daily. 03/09/13  Yes Rinaldo Cloud, MD  isosorbide mononitrate (IMDUR) 60 MG 24 hr tablet Take 60 mg by mouth 2 (two) times daily.   Yes Historical Kaprice Kage, MD  lisinopril (PRINIVIL,ZESTRIL) 10 MG tablet  Take 10 mg by mouth daily.    Yes Historical Janssen Zee, MD  metoprolol tartrate (LOPRESSOR) 25 MG tablet Take 0.5 tablets (12.5 mg total) by mouth 3 (three) times daily. Patient taking differently: Take 12.5 mg by mouth 2 (two) times daily.  11/12/13  Yes Orpah Cobb, MD  nitroGLYCERIN (NITROSTAT) 0.4 MG SL tablet Place 1 tablet (0.4 mg total) under the tongue every 5 (five) minutes as needed for chest pain. 11/12/13  Yes Orpah Cobb, MD  pravastatin (PRAVACHOL) 40 MG tablet Take  40 mg by mouth 2 (two) times daily.   Yes Historical Kathrynne Kulinski, MD   BP 132/73 mmHg  Pulse 67  Temp(Src) 98.2 F (36.8 C) (Oral)  Resp 18  Ht  (1.676 m)  Wt 170 lb (77.111 kg)  BMI 27.45 kg/m2  SpO2 97% Physical Exam  Constitutional: He is oriented to person, place, and time. He appears well-developed and well-nourished.  Non-toxic appearance. No distress.  HENT:  Head: Normocephalic and atraumatic.  Eyes: Conjunctivae, EOM and lids are normal. Pupils are equal, round, and reactive to light.  Neck: Normal range of motion. Neck supple. No tracheal deviation present. No thyroid mass present.  Cardiovascular: Normal rate, regular rhythm and normal heart sounds.  Exam reveals no gallop.   No murmur heard. Pulmonary/Chest: Effort normal and breath sounds normal. No stridor. No respiratory distress. He has no decreased breath sounds. He has no wheezes. He has no rhonchi. He has no rales.  Abdominal: Soft. Normal appearance and bowel sounds are normal. He exhibits no distension. There is no tenderness. There is no rebound and no CVA tenderness.  Musculoskeletal: Normal range of motion. He exhibits no edema or tenderness.  Neurological: He is alert and oriented to person, place, and time. He has normal strength. No cranial nerve deficit or sensory deficit. GCS eye subscore is 4. GCS verbal subscore is 5. GCS motor subscore is 6.  Skin: Skin is warm and dry. No abrasion and no rash noted.  Psychiatric: He has a normal mood and affect. His speech is normal and behavior is normal.  Nursing note and vitals reviewed.   ED Course  Procedures (including critical care time) Labs Review Labs Reviewed  CBC  BASIC METABOLIC PANEL  I-STAT TROPOININ, ED    Imaging Review No results found. I have personally reviewed and evaluated these images and lab results as part of my medical decision-making.   EKG Interpretation   Date/Time:  Friday March 04 2015 08:19:15 EDT Ventricular Rate:   68 PR Interval:  172 QRS Duration: 72 QT Interval:  402 QTC Calculation: 427 R Axis:   -51 Text Interpretation:  Normal sinus rhythm Low voltage QRS Left anterior  fascicular block Abnormal ECG No significant change since last tracing  Confirmed by ALLEN  MD, ANTHONY ([id number]) on 03/04/2015 8:59:31 AM      MDM   Final diagnoses:  None    Patient will be given nitroglycerin here and I will assess the effect. Patient's EKG is unchanged from prior. Will consult his cardiologist for admission.    Lorre Nick, MD 03/04/15 0900

## 2015-03-04 NOTE — ED Notes (Signed)
Pt does not wish to receive third nitro at this time.

## 2015-03-04 NOTE — ED Notes (Signed)
MD Allen at the bedside.  

## 2015-03-04 NOTE — ED Notes (Signed)
Pt reports that he started having chest pain this morning. Reports stent placement 2 years ago. No n/v or SOB.

## 2015-03-04 NOTE — H&P (Signed)
Referring Physician:  DANE KOPKE is an 64 y.o. male.                       Chief Complaint: Chest pain  HPI: 64 yr old male with left sided chest pain since this AM, non radiating, without shortness of breath or sweating spell.  Past Medical History  Diagnosis Date  . Hypertension   . CAD (coronary artery disease)   . High cholesterol   . Anginal pain   . Gout       Past Surgical History  Procedure Laterality Date  . Cardiac catheterization  2000; 2007    "1 + 2" (02/04/2013)  . Coronary angioplasty  02/04/2013  . Left heart catheterization with coronary angiogram N/A 02/04/2013    Procedure: LEFT HEART CATHETERIZATION WITH CORONARY ANGIOGRAM;  Surgeon: Birdie Riddle, MD;  Location: [facility];  Service: Cardiovascular;  Laterality: N/A;  . Percutaneous coronary intervention-balloon only  02/04/2013    Procedure: PERCUTANEOUS CORONARY INTERVENTION-BALLOON ONLY;  Surgeon: Birdie Riddle, MD;  Location: [place] C[facility];  Service: Cardiovascular;;  . Left heart catheterization with coronary angiogram N/A 02/11/2013    Procedure: LEFT HEART CATHETERIZATION WITH CORONARY ANGIOGRAM;  Surgeon: Birdie Riddle, MD;  Location: [facility] C[facility];  Service: Cardiovascular;  Laterality: N/A;  . Left heart catheterization with coronary angiogram N/A 11/12/2013    Procedure: LEFT HEART CATHETERIZATION WITH CORONARY ANGIOGRAM;  Surgeon: Birdie Riddle, MD;  Location: [facility] CATH LAB;  Service: Cardiovascular;  Laterality: N/A;    History reviewed. No pertinent family history. Social History:  reports that he has never smoked. He has never used smokeless tobacco. He reports that he drinks about 7.2 oz of alcohol per week. He reports that he does not use illicit drugs.  Allergies: No Known Allergies   (Not in a hospital admission)  Results for orders placed or performed during the hospital encounter of 03/04/15 (from the past 48 hour(s))  Basic metabolic panel     Status: Abnormal   Collection Time:  03/04/15  8:34 AM  Result Value Ref Range   Sodium 137 135 - 145 mmol/L   Potassium 4.2 3.5 - 5.1 mmol/L   Chloride 105 101 - 111 mmol/L   CO2 25 22 - 32 mmol/L   Glucose, Bld 110 (H) 65 - 99 mg/dL   BUN 17 6 - 20 mg/dL   Creatinine, Ser 1.35 (H) 0.61 - 1.24 mg/dL   Calcium 9.5 8.9 - 10.3 mg/dL   GFR calc non Af Amer 54 (L) >60 mL/min   GFR calc Af Amer >60 >60 mL/min    Comment: (NOTE) The eGFR has been calculated using the CKD EPI equation. This calculation has not been validated in all clinical situations. eGFR's persistently <60 mL/min signify possible Chronic Kidney Disease.    Anion gap 7 5 - 15  CBC     Status: None   Collection Time: 03/04/15  8:34 AM  Result Value Ref Range   WBC 9.1 4.0 - 10.5 K/uL   RBC 4.77 4.22 - 5.81 MIL/uL   Hemoglobin 15.0 13.0 - 17.0 g/dL   HCT 44.1 39.0 - 52.0 %   MCV 92.5 78.0 - 100.0 fL   MCH 31.4 26.0 - 34.0 pg   MCHC 34.0 30.0 - 36.0 g/dL   RDW 13.0 11.5 - 15.5 %   Platelets 315 150 - 400 K/uL  I-stat troponin, ED     Status: None  Collection Time: 03/04/15  8:46 AM  Result Value Ref Range   Troponin i, poc 0.00 0.00 - 0.08 ng/mL   Comment 3            Comment: Due to the release kinetics of cTnI, a negative result within the first hours of the onset of symptoms does not rule out myocardial infarction with certainty. If myocardial infarction is still suspected, repeat the test at appropriate intervals.    Dg Chest 2 View  03/04/2015   CLINICAL DATA:  Initial encounter for left-sided chest pain beginning this morning.  EXAM: CHEST  2 VIEW  COMPARISON:  11/11/2013.  FINDINGS: The lungs are clear without focal pneumonia, edema, pneumothorax or pleural effusion. Cardiopericardial silhouette is at upper limits of normal for size. Imaged bony structures of the thorax are intact. Trace atelectasis or linear scarring noted at the bases.  IMPRESSION: Low volume film with basilar atelectasis or scarring. No acute cardiopulmonary findings.    Electronically Signed   By: Misty Stanley M.D.   On: 03/04/2015 09:23    Review Of Systems Denies recent weight gain or weight loss. Wears glasses. Has a left eye injury 25 years ago. Has recurrent chest pain. No history of asthma, COPD, palpitations, leg swelling, GI bleed, hepatitis, blood transfusion, kidney stone, stroke, seizures or psychiatric admissions.   Blood pressure 120/75, pulse 64, temperature 98.2 F (36.8 C), temperature source Oral, resp. rate 16, height 5' 6"  (1.676 m), weight 77.111 kg (170 lb), SpO2 95 %. Physical Exam: GENERAL: Patient is alert and oriented x 3.  HEENT: Patient is normocephalic, atraumatic. Extraocular movement intact. Conjunctivae are pink. Sclerae are non-icteric. He has unequal pupils, but reacting to light.  NECK: No JVD. No carotid bruit. No thyromegaly. Full range of motion of the neck.  LUNGS: Clear bilaterally. No chest wall tenderness on palpation HEART: Normal S1, S2 with a grade II/VI systolic murmur.  ABDOMEN: Soft and nontender.  EXTREMITIES: No edema, cyanosis or clubbing.  CNS: Cranial nerves grossly intact. Patient moves all 4 extremities.  SKIN: Warm and dry  Assessment/Plan Unstable angina CAD S/P stent in LAD/Diagonal coronary arteries.  Admit/IV heparin/Morphine/Troponin-I Nuclear stress test in AM.  Birdie Riddle, MD  03/04/2015, 11:07 AM

## 2015-03-04 NOTE — Progress Notes (Addendum)
ANTICOAGULATION CONSULT NOTE - Initial Consult  Pharmacy Consult for heparin Indication: chest pain/ACS  No Known Allergies  Patient Measurements: Height:  (167.6 cm) Weight: 170 lb (77.111 kg) IBW/kg (Calculated) : 63.8 Heparin Dosing Weight: 77kg  Vital Signs: Temp: 98.2 F (36.8 C) (09/23 0824) Temp Source: Oral (09/23 0824) BP: 120/75 mmHg (09/23 0845) Pulse Rate: 64 (09/23 0845)  Labs:  Recent Labs  03/04/15 0834  HGB 15.0  HCT 44.1  PLT 315  CREATININE 1.35*    Estimated Creatinine Clearance: 53.3 mL/min (by C-G formula based on Cr of 1.35).   Medical History: Past Medical History  Diagnosis Date  . Hypertension   . CAD (coronary artery disease)   . High cholesterol   . Anginal pain   . Gout     Medications:  Infusions:  . sodium chloride    . heparin      Assessment: 63 yom presented to the ED with CP. To start IV heparin for anticoagulation. Baseline CBC is WNL. He not on any anticoagulation PTA except for aspirin and plavix. Troponin is negative so far.   Goal of Therapy:  Heparin level 0.3-0.7 units/ml Monitor platelets by anticoagulation protocol: Yes   Plan:  - Heparin bolus 4000 units IV x 1 - Heparin gtt 1050 units/hr - Check a 6 hour heparin level - Daily heparin level and CBC  Rumbarger, Drake Leach 03/04/2015,11:13 AM   =======================   Addendum: - HL 0.65, high normal - no bleeding reported   Plan: - Decrease heparin gtt slightly 1000 units/hr - F/U AM labs    Thuy D. Laney Potash, PharmD, BCPS Pager:  [phone] 03/04/2015, 7:52 PM

## 2015-03-05 ENCOUNTER — Observation Stay (HOSPITAL_COMMUNITY): Payer: Medicare Other

## 2015-03-05 DIAGNOSIS — I10 Essential (primary) hypertension: Secondary | ICD-10-CM | POA: Diagnosis not present

## 2015-03-05 DIAGNOSIS — R079 Chest pain, unspecified: Secondary | ICD-10-CM | POA: Diagnosis not present

## 2015-03-05 DIAGNOSIS — E780 Pure hypercholesterolemia: Secondary | ICD-10-CM | POA: Diagnosis not present

## 2015-03-05 DIAGNOSIS — I25119 Atherosclerotic heart disease of native coronary artery with unspecified angina pectoris: Secondary | ICD-10-CM | POA: Diagnosis not present

## 2015-03-05 LAB — PROTIME-INR
INR: 1.05 (ref 0.00–1.49)
PROTHROMBIN TIME: 13.9 s (ref 11.6–15.2)

## 2015-03-05 LAB — LIPID PANEL
CHOL/HDL RATIO: 4.3 ratio
Cholesterol: 183 mg/dL (ref 0–200)
HDL: 43 mg/dL (ref >40)
LDL CALC: 118 mg/dL — AB (ref 0–99)
Triglycerides: 108 mg/dL (ref <150)
VLDL: 22 mg/dL (ref 0–40)

## 2015-03-05 LAB — BASIC METABOLIC PANEL
Anion gap: 7 (ref 5–15)
BUN: 17 mg/dL (ref 6–20)
CALCIUM: 9.2 mg/dL (ref 8.9–10.3)
CO2: 28 mmol/L (ref 22–32)
CREATININE: 1.36 mg/dL — AB (ref 0.61–1.24)
Chloride: 102 mmol/L (ref 101–111)
GFR calc Af Amer: >60 mL/min (ref >60)
GFR, EST NON AFRICAN AMERICAN: 53 mL/min — AB (ref >60)
GLUCOSE: 106 mg/dL — AB (ref 65–99)
Potassium: 4.5 mmol/L (ref 3.5–5.1)
Sodium: 137 mmol/L (ref 135–145)

## 2015-03-05 LAB — CBC
HEMATOCRIT: 42.5 % (ref 39.0–52.0)
Hemoglobin: 14.3 g/dL (ref 13.0–17.0)
MCH: 31.4 pg (ref 26.0–34.0)
MCHC: 33.6 g/dL (ref 30.0–36.0)
MCV: 93.4 fL (ref 78.0–100.0)
Platelets: 297 10*3/uL (ref 150–400)
RBC: 4.55 MIL/uL (ref 4.22–5.81)
RDW: 13.2 % (ref 11.5–15.5)
WBC: 7.9 10*3/uL (ref 4.0–10.5)

## 2015-03-05 LAB — NM MYOCAR MULTI W/SPECT W/WALL MOTION / EF
CHL CUP NUCLEAR SRS: 5
LHR: 0.04
LV sys vol: 21 mL
LVDIAVOL: 54 mL
NUC STRESS TID: 1.28
SDS: 0
SSS: 5

## 2015-03-05 LAB — HEPARIN LEVEL (UNFRACTIONATED): HEPARIN UNFRACTIONATED: 0.67 [IU]/mL (ref 0.30–0.70)

## 2015-03-05 LAB — TROPONIN I: Troponin I: <0.03 ng/mL (ref <0.031)

## 2015-03-05 MED ORDER — ALPRAZOLAM 0.25 MG PO TABS: 0.2500 mg | ORAL_TABLET | Freq: Two times a day (BID) | ORAL | Status: DC | PRN

## 2015-03-05 MED ORDER — TECHNETIUM TC 99M SESTAMIBI GENERIC - CARDIOLITE
30.0000 | Freq: Once | INTRAVENOUS | Status: AC | PRN
  Administered 2015-03-05: 30 via INTRAVENOUS

## 2015-03-05 MED ORDER — REGADENOSON 0.4 MG/5ML IV SOLN
0.4000 mg | Freq: Once | INTRAVENOUS | Status: AC
  Administered 2015-03-05: 0.4 mg via INTRAVENOUS
  Filled 2015-03-05: qty 5

## 2015-03-05 MED ORDER — TECHNETIUM TC 99M SESTAMIBI GENERIC - CARDIOLITE
10.0000 | Freq: Once | INTRAVENOUS | Status: AC | PRN
  Administered 2015-03-05: 10 via INTRAVENOUS

## 2015-03-05 MED ORDER — METOPROLOL TARTRATE 25 MG PO TABS: 12.5000 mg | ORAL_TABLET | Freq: Two times a day (BID) | ORAL | Status: DC

## 2015-03-05 MED ORDER — LISINOPRIL 10 MG PO TABS: 10.0000 mg | ORAL_TABLET | Freq: Every day | ORAL | Status: AC

## 2015-03-05 MED ORDER — REGADENOSON 0.4 MG/5ML IV SOLN
INTRAVENOUS | Status: AC
  Filled 2015-03-05: qty 5

## 2015-03-05 MED ORDER — PRAVASTATIN SODIUM 40 MG PO TABS
40.0000 mg | ORAL_TABLET | Freq: Every evening | ORAL | Status: DC
Start: 2015-03-05 — End: 2020-02-01

## 2015-03-05 NOTE — Discharge Summary (Signed)
Physician Discharge Summary  Patient ID: [name] MRN: [id number] DOB/AGE: 08-09-1950 64 y.o.  Admit date: 03/04/2015 Discharge date: 03/05/2015  Admission Diagnoses: Unstable angina CAD S/P stent in LAD/Diagonal coronary arteries.  Discharge Diagnoses:  Principle Problem: * Unstable angina * CAD S/P stent in LAD/Diagonal coronary arteries. Anxiety CKD, II  Discharged Condition: good  Hospital Course: 64 yr old male with left sided chest pain since this AM, non radiating, without shortness of breath or sweating spell. He underwent Lexiscan cardiolyte stress test that did not show reversible ischemia hence he was discharged home with f/u by me in 2 weeks.  Consults: cardiology  Significant Diagnostic Studies: labs: Normal CBC, BMET near normal with mildly elevated creatinine of 1.35. Troponin-I normal x 2.  EKG-NSR, low voltage.  Nuclear stress test: 1. No reversible ischemia or infarction. 2. Normal left ventricular wall motion. 3. Left ventricular ejection fraction 62%. 4. Low-risk stress test findings*.  Treatments: cardiac meds: lisinopril (Zestril), metoprolol, amlodipine, aspirin and Plavix.  Discharge Exam: Blood pressure 110/71, pulse 68, temperature 97.3 F (36.3 C), temperature source Oral, resp. rate 18, height  (1.676 m), weight 74.118 kg (163 lb 6.4 oz), SpO2 100 %.  Physical Exam: GENERAL: Patient is alert and oriented x 3.  HEENT: Patient is normocephalic, atraumatic. Extraocular movement intact. Conjunctivae are pink. Sclerae are non-icteric. He has unequal pupils, but reacting to light.  NECK: No JVD. No carotid bruit. No thyromegaly. Full range of motion of the neck.  LUNGS: Clear bilaterally. No chest wall tenderness on palpation HEART: Normal S1, S2 with a grade II/VI systolic murmur.  ABDOMEN: Soft and nontender.  EXTREMITIES: No edema, cyanosis or clubbing.  CNS: Cranial nerves grossly intact. Patient moves all 4 extremities.   SKIN: Warm and dry  Disposition: 01-Home or Self Care     Medication List    TAKE these medications        ALPRAZolam 0.25 MG tablet  Commonly known as:  XANAX  Take 1 tablet (0.25 mg total) by mouth 2 (two) times daily as needed for anxiety.     amLODipine 2.5 MG tablet  Commonly known as:  NORVASC  Take 2.5 mg by mouth daily.     aspirin EC 81 MG tablet  Take 81 mg by mouth daily.     clopidogrel 75 MG tablet  Commonly known as:  PLAVIX  Take 1 tablet (75 mg total) by mouth daily with breakfast.     famotidine 20 MG tablet  Commonly known as:  PEPCID  Take 1 tablet (20 mg total) by mouth 2 (two) times daily.     isosorbide mononitrate 60 MG 24 hr tablet  Commonly known as:  IMDUR  Take 60 mg by mouth 2 (two) times daily.     lisinopril 10 MG tablet  Commonly known as:  PRINIVIL,ZESTRIL  Take 1 tablet (10 mg total) by mouth daily.     metoprolol tartrate 25 MG tablet  Commonly known as:  LOPRESSOR  Take 0.5 tablets (12.5 mg total) by mouth 2 (two) times daily.     nitroGLYCERIN 0.4 MG SL tablet  Commonly known as:  NITROSTAT  Place 1 tablet (0.4 mg total) under the tongue every 5 (five) minutes as needed for chest pain.     pravastatin 40 MG tablet  Commonly known as:  PRAVACHOL  Take 1 tablet (40 mg total) by mouth every evening.           Follow-up Information    Follow  up with [facility] S, MD. Schedule an appointment as soon as possible for a visit in 2 weeks.   Specialty:  Cardiology   Contact information:   [address] [place] [address] [phone]       Signed: [name] 03/05/2015, 3:45 PM

## 2015-03-05 NOTE — Progress Notes (Signed)
ANTICOAGULATION CONSULT NOTE - Follow up Consult  Pharmacy Consult for heparin Indication: chest pain/ACS  No Known Allergies  Patient Measurements: Height:  (167.6 cm) Weight: 163 lb 6.4 oz (74.118 kg) IBW/kg (Calculated) : 63.8 Heparin Dosing Weight: 77kg  Vital Signs: Temp: 97.6 F (36.4 C) (09/24 0500) Temp Source: Oral (09/24 0500) BP: 138/69 mmHg (09/24 0938) Pulse Rate: 93 (09/24 0940)  Labs:  Recent Labs  03/04/15 0834 03/04/15 1129 03/04/15 1910 03/04/15 2311 03/05/15 0443  HGB 15.0  --   --   --  14.3  HCT 44.1  --   --   --  42.5  PLT 315  --   --   --  297  LABPROT  --   --   --   --  13.9  INR  --   --   --   --  1.05  HEPARINUNFRC  --   --  0.65  --  0.67  CREATININE 1.35*  --   --   --  1.36*  TROPONINI  --  <0.03 <0.03 <0.03  --     Estimated Creatinine Clearance: 48.9 mL/min (by C-G formula based on Cr of 1.36).   Medical History: Past Medical History  Diagnosis Date  . Hypertension   . CAD (coronary artery disease)   . High cholesterol   . Anginal pain   . Gout     Medications:  Infusions:  . heparin 1,000 Units/hr (03/04/15 2006)    Assessment: 63 yom presented to the ED with CP. To start IV heparin for R/o ICS. Baseline CBC is WNL. No PTA anticoagulation except for aspirin and plavix. HL 0.67 up from 0.65 after rate change to 1000 units/hr yesterday. Close to upper limit of therapeutic range. Will slightly decrease rate to avoid overshooting. Hgb 14.3, plt 297. No bleeding reported.   .   Goal of Therapy:  Heparin level 0.3-0.7 units/ml Monitor platelets by anticoagulation protocol: Yes   Plan:  Decrease heparin gtt to 950 units/hr - check AM HL Daily HL and CBC Monitor for s/sx bleeding  Reuel Derby PharmD. PGY1 Resident Pager [phone] 03/05/2015,10:07 AM

## 2016-03-16 ENCOUNTER — Encounter (HOSPITAL_COMMUNITY): Payer: Self-pay | Admitting: *Deleted

## 2016-03-16 ENCOUNTER — Emergency Department (HOSPITAL_COMMUNITY)
Admission: EM | Admit: 2016-03-16 | Discharge: 2016-03-16 | Disposition: A | Discharge status: Home / Self Care | Payer: Medicare Other | Attending: Emergency Medicine | Admitting: Emergency Medicine

## 2016-03-16 ENCOUNTER — Emergency Department (HOSPITAL_COMMUNITY): Payer: Medicare Other

## 2016-03-16 DIAGNOSIS — Z7982 Long term (current) use of aspirin: Secondary | ICD-10-CM | POA: Diagnosis not present

## 2016-03-16 DIAGNOSIS — Z5321 Procedure and treatment not carried out due to patient leaving prior to being seen by health care provider: Secondary | ICD-10-CM | POA: Insufficient documentation

## 2016-03-16 DIAGNOSIS — I2510 Atherosclerotic heart disease of native coronary artery without angina pectoris: Secondary | ICD-10-CM | POA: Diagnosis not present

## 2016-03-16 DIAGNOSIS — Z955 Presence of coronary angioplasty implant and graft: Secondary | ICD-10-CM | POA: Diagnosis not present

## 2016-03-16 DIAGNOSIS — I10 Essential (primary) hypertension: Secondary | ICD-10-CM | POA: Insufficient documentation

## 2016-03-16 DIAGNOSIS — R079 Chest pain, unspecified: Secondary | ICD-10-CM

## 2016-03-16 LAB — CBC
HCT: 43.1 % (ref 39.0–52.0)
Hemoglobin: 14.5 g/dL (ref 13.0–17.0)
MCH: 31.1 pg (ref 26.0–34.0)
MCHC: 33.6 g/dL (ref 30.0–36.0)
MCV: 92.5 fL (ref 78.0–100.0)
Platelets: 352 10*3/uL (ref 150–400)
RBC: 4.66 MIL/uL (ref 4.22–5.81)
RDW: 12.9 % (ref 11.5–15.5)
WBC: 8.2 10*3/uL (ref 4.0–10.5)

## 2016-03-16 LAB — BASIC METABOLIC PANEL
Anion gap: 8 (ref 5–15)
BUN: 15 mg/dL (ref 6–20)
CALCIUM: 9.5 mg/dL (ref 8.9–10.3)
CHLORIDE: 106 mmol/L (ref 101–111)
CO2: 23 mmol/L (ref 22–32)
CREATININE: 1.24 mg/dL (ref 0.61–1.24)
GFR calc non Af Amer: 59 mL/min — ABNORMAL LOW (ref >60)
GLUCOSE: 105 mg/dL — AB (ref 65–99)
Potassium: 4 mmol/L (ref 3.5–5.1)
Sodium: 137 mmol/L (ref 135–145)

## 2016-03-16 LAB — I-STAT TROPONIN, ED: Troponin i, poc: 0 ng/mL (ref 0.00–0.08)

## 2016-03-16 NOTE — ED Notes (Signed)
Patient didn't answer when called  To go to a room

## 2016-03-16 NOTE — ED Triage Notes (Signed)
Pt reports onset pta of left side chest pain, denies sob or n/v. No acute distress noted at triage, ekg done.

## 2016-03-17 NOTE — ED Provider Notes (Signed)
EKG Interpretation  Date/Time:  Friday March 16 2016 15:14:44 EDT Ventricular Rate:  81 PR Interval:  166 QRS Duration: 98 QT Interval:  400 QTC Calculation: 464 R Axis:   -51 Text Interpretation:  Normal sinus rhythm Possible Left atrial enlargement Low voltage QRS Incomplete right bundle branch block Left anterior fascicular block T wave abnormality, consider anterior ischemia Prolonged QT Abnormal ECG nonspecific anterior st and t wave changes Confirmed by Willye Javier  MD, Caryn BeeKEVIN ([id number]) on 03/16/2016 9:10:10 PM       I did not personally see or evaluate this patient.  This patient left the emergency department prior to my evaluation   Azalia BilisKevin Maecy Podgurski, MD 03/17/16 [phone]

## 2016-07-04 ENCOUNTER — Emergency Department (HOSPITAL_COMMUNITY): Payer: Medicare Other

## 2016-07-04 ENCOUNTER — Encounter (HOSPITAL_COMMUNITY): Payer: Self-pay | Admitting: Emergency Medicine

## 2016-07-04 ENCOUNTER — Observation Stay (HOSPITAL_COMMUNITY)
Admission: EM | Admit: 2016-07-04 | Discharge: 2016-07-06 | Disposition: A | Discharge status: Home / Self Care | Payer: Medicare Other | Attending: Cardiovascular Disease | Admitting: Cardiovascular Disease

## 2016-07-04 DIAGNOSIS — R079 Chest pain, unspecified: Secondary | ICD-10-CM | POA: Diagnosis present

## 2016-07-04 DIAGNOSIS — Z955 Presence of coronary angioplasty implant and graft: Secondary | ICD-10-CM | POA: Insufficient documentation

## 2016-07-04 DIAGNOSIS — E7800 Pure hypercholesterolemia, unspecified: Secondary | ICD-10-CM | POA: Diagnosis not present

## 2016-07-04 DIAGNOSIS — Z7901 Long term (current) use of anticoagulants: Secondary | ICD-10-CM | POA: Insufficient documentation

## 2016-07-04 DIAGNOSIS — Z79899 Other long term (current) drug therapy: Secondary | ICD-10-CM | POA: Insufficient documentation

## 2016-07-04 DIAGNOSIS — E785 Hyperlipidemia, unspecified: Secondary | ICD-10-CM | POA: Diagnosis not present

## 2016-07-04 DIAGNOSIS — I444 Left anterior fascicular block: Secondary | ICD-10-CM | POA: Diagnosis not present

## 2016-07-04 DIAGNOSIS — I10 Essential (primary) hypertension: Secondary | ICD-10-CM | POA: Diagnosis not present

## 2016-07-04 DIAGNOSIS — I25110 Atherosclerotic heart disease of native coronary artery with unstable angina pectoris: Principal | ICD-10-CM | POA: Insufficient documentation

## 2016-07-04 DIAGNOSIS — M109 Gout, unspecified: Secondary | ICD-10-CM | POA: Diagnosis not present

## 2016-07-04 DIAGNOSIS — I200 Unstable angina: Secondary | ICD-10-CM | POA: Diagnosis present

## 2016-07-04 HISTORY — DX: Unspecified osteoarthritis, unspecified site: M19.90

## 2016-07-04 HISTORY — DX: Gastro-esophageal reflux disease without esophagitis: K21.9

## 2016-07-04 LAB — CBC
HEMATOCRIT: 44.8 % (ref 39.0–52.0)
Hemoglobin: 15 g/dL (ref 13.0–17.0)
MCH: 30.4 pg (ref 26.0–34.0)
MCHC: 33.5 g/dL (ref 30.0–36.0)
MCV: 90.7 fL (ref 78.0–100.0)
Platelets: 350 10*3/uL (ref 150–400)
RBC: 4.94 MIL/uL (ref 4.22–5.81)
RDW: 13 % (ref 11.5–15.5)
WBC: 7.6 10*3/uL (ref 4.0–10.5)

## 2016-07-04 LAB — BASIC METABOLIC PANEL
Anion gap: 9 (ref 5–15)
BUN: 15 mg/dL (ref 6–20)
CALCIUM: 9.8 mg/dL (ref 8.9–10.3)
CO2: 26 mmol/L (ref 22–32)
Chloride: 103 mmol/L (ref 101–111)
Creatinine, Ser: 1.58 mg/dL — ABNORMAL HIGH (ref 0.61–1.24)
GFR calc Af Amer: 51 mL/min — ABNORMAL LOW (ref >60)
GFR, EST NON AFRICAN AMERICAN: 44 mL/min — AB (ref >60)
GLUCOSE: 116 mg/dL — AB (ref 65–99)
Potassium: 4.4 mmol/L (ref 3.5–5.1)
Sodium: 138 mmol/L (ref 135–145)

## 2016-07-04 LAB — I-STAT TROPONIN, ED: Troponin i, poc: 0.01 ng/mL (ref 0.00–0.08)

## 2016-07-04 LAB — TROPONIN I
Troponin I: <0.03 ng/mL (ref <0.03)
Troponin I: <0.03 ng/mL (ref <0.03)
Troponin I: <0.03 ng/mL (ref <0.03)

## 2016-07-04 LAB — HEPARIN LEVEL (UNFRACTIONATED): HEPARIN UNFRACTIONATED: 0.62 [IU]/mL (ref 0.30–0.70)

## 2016-07-04 MED ORDER — HEPARIN BOLUS VIA INFUSION
4000.0000 [IU] | Freq: Once | INTRAVENOUS | Status: AC
  Administered 2016-07-04: 4000 [IU] via INTRAVENOUS
  Filled 2016-07-04: qty 4000

## 2016-07-04 MED ORDER — METOPROLOL TARTRATE 12.5 MG HALF TABLET
12.5000 mg | ORAL_TABLET | Freq: Two times a day (BID) | ORAL | Status: DC
  Administered 2016-07-04 – 2016-07-06 (×4): 12.5 mg via ORAL
  Filled 2016-07-04 (×4): qty 1

## 2016-07-04 MED ORDER — LISINOPRIL 10 MG PO TABS
10.0000 mg | ORAL_TABLET | Freq: Every day | ORAL | Status: DC
  Administered 2016-07-05 – 2016-07-06 (×2): 10 mg via ORAL
  Filled 2016-07-04 (×2): qty 1

## 2016-07-04 MED ORDER — CLOPIDOGREL BISULFATE 75 MG PO TABS
75.0000 mg | ORAL_TABLET | Freq: Every day | ORAL | Status: DC
  Administered 2016-07-05 – 2016-07-06 (×2): 75 mg via ORAL
  Filled 2016-07-04 (×2): qty 1

## 2016-07-04 MED ORDER — ACETAMINOPHEN 325 MG PO TABS: 650.0000 mg | ORAL_TABLET | ORAL | Status: DC | PRN

## 2016-07-04 MED ORDER — HEPARIN (PORCINE) IN NACL 100-0.45 UNIT/ML-% IJ SOLN
1000.0000 [IU]/h | INTRAMUSCULAR | Status: DC
  Administered 2016-07-04 – 2016-07-05 (×2): 1000 [IU]/h via INTRAVENOUS
  Filled 2016-07-04 (×2): qty 250

## 2016-07-04 MED ORDER — PRAVASTATIN SODIUM 40 MG PO TABS
40.0000 mg | ORAL_TABLET | Freq: Every evening | ORAL | Status: DC
  Administered 2016-07-05: 40 mg via ORAL
  Filled 2016-07-04: qty 1

## 2016-07-04 MED ORDER — ASPIRIN EC 81 MG PO TBEC
81.0000 mg | DELAYED_RELEASE_TABLET | Freq: Every day | ORAL | Status: DC
  Administered 2016-07-05 – 2016-07-06 (×2): 81 mg via ORAL
  Filled 2016-07-04 (×2): qty 1

## 2016-07-04 MED ORDER — ALPRAZOLAM 0.25 MG PO TABS: 0.2500 mg | ORAL_TABLET | Freq: Two times a day (BID) | ORAL | Status: DC | PRN

## 2016-07-04 MED ORDER — ASPIRIN 81 MG PO CHEW
324.0000 mg | CHEWABLE_TABLET | Freq: Once | ORAL | Status: AC
  Administered 2016-07-04: 324 mg via ORAL
  Filled 2016-07-04: qty 4

## 2016-07-04 MED ORDER — ISOSORBIDE MONONITRATE ER 60 MG PO TB24
60.0000 mg | ORAL_TABLET | Freq: Two times a day (BID) | ORAL | Status: DC
  Administered 2016-07-04 – 2016-07-06 (×4): 60 mg via ORAL
  Filled 2016-07-04 (×4): qty 1

## 2016-07-04 MED ORDER — AMLODIPINE BESYLATE 5 MG PO TABS
5.0000 mg | ORAL_TABLET | Freq: Every evening | ORAL | Status: DC
  Administered 2016-07-04 – 2016-07-05 (×2): 5 mg via ORAL
  Filled 2016-07-04 (×2): qty 1

## 2016-07-04 MED ORDER — ONDANSETRON HCL 4 MG/2ML IJ SOLN: 4.0000 mg | Freq: Four times a day (QID) | INTRAMUSCULAR | Status: DC | PRN

## 2016-07-04 MED ORDER — FAMOTIDINE 20 MG PO TABS
20.0000 mg | ORAL_TABLET | Freq: Two times a day (BID) | ORAL | Status: DC
  Administered 2016-07-04 – 2016-07-06 (×4): 20 mg via ORAL
  Filled 2016-07-04 (×4): qty 1

## 2016-07-04 MED ORDER — NITROGLYCERIN 0.4 MG SL SUBL
0.4000 mg | SUBLINGUAL_TABLET | SUBLINGUAL | Status: DC | PRN
  Administered 2016-07-04: 0.4 mg via SUBLINGUAL
  Filled 2016-07-04 (×2): qty 1

## 2016-07-04 MED ORDER — ALLOPURINOL 100 MG PO TABS
100.0000 mg | ORAL_TABLET | Freq: Every day | ORAL | Status: DC
  Administered 2016-07-05 – 2016-07-06 (×2): 100 mg via ORAL
  Filled 2016-07-04 (×2): qty 1

## 2016-07-04 NOTE — Progress Notes (Signed)
Dr Algie CofferKadakia notified of pt's arrival to floor.  Instructed to put in order to keep pt NPO after MN tonight.  Amanda PeaNellie Tabia Landowski, Charity fundraiserN.

## 2016-07-04 NOTE — ED Notes (Signed)
The pt remains chest pain free

## 2016-07-04 NOTE — ED Notes (Signed)
Attempted Report 

## 2016-07-04 NOTE — Progress Notes (Signed)
ANTICOAGULATION CONSULT NOTE  Pharmacy Consult for heparin Indication: chest pain/ACS  No Known Allergies  Patient Measurements: Height: 5\' 7"  (170.2 cm) Weight: 171 lb 12.8 oz (77.9 kg) IBW/kg (Calculated) : 66.1 Heparin Dosing Weight: 77.1 kg  Vital Signs: Temp: 98 F (36.7 C) (01/24 1552) Temp Source: Oral (01/24 1552) BP: 134/82 (01/24 1736) Pulse Rate: 61 (01/24 1552)  Labs:  Recent Labs  07/04/16 0807 07/04/16 1040 07/04/16 1554 07/04/16 1941  HGB 15.0  --   --   --   HCT 44.8  --   --   --   PLT 350  --   --   --   HEPARINUNFRC  --   --   --  0.62  CREATININE 1.58*  --   --   --   TROPONINI  --  <0.03 <0.03  --     Estimated Creatinine Clearance: 42.4 mL/min (by C-G formula based on SCr of 1.58 mg/dL (H)).   Medical History: Past Medical History:  Diagnosis Date  . Anginal pain (HCC)   . CAD (coronary artery disease)   . Gout   . High cholesterol   . Hypertension     Assessment: 66 yo man continuing on heparin for ACS/chest pain. CBC WNL, no bleeding documented. No anticoagulants PTA.   Heparin level therapeutic (0.62) on 1000 units/h. For nuclear stress test or cath in AM per Cardiology note.  Goal of Therapy:  Heparin level 0.3-0.7 units/ml Monitor platelets by anticoagulation protocol: Yes   Plan: Heparin infusion at 1000 units/hr 6h heparin level to confirm Daily heparin level/CBC Monitor H&H and platelets, s/sx bleeding   Babs BertinHaley Addis Bennie, PharmD, BCPS Clinical Pharmacist 07/04/2016 8:22 PM

## 2016-07-04 NOTE — Progress Notes (Signed)
ANTICOAGULATION CONSULT NOTE - Initial Consult  Pharmacy Consult for heparin Indication: chest pain/ACS  No Known Allergies  Patient Measurements: Height: 5\' 7"  (170.2 cm) Weight: 170 lb (77.1 kg) IBW/kg (Calculated) : 66.1 Heparin Dosing Weight: 77.1 kg  Vital Signs: Temp: 97.7 F (36.5 C) (01/24 0805) Temp Source: Oral (01/24 0805) BP: 149/88 (01/24 1030) Pulse Rate: 72 (01/24 1030)  Labs:  Recent Labs  07/04/16 0807  HGB 15.0  HCT 44.8  PLT 350  CREATININE 1.58*    Estimated Creatinine Clearance: 42.4 mL/min (by C-G formula based on SCr of 1.58 mg/dL (H)).   Medical History: Past Medical History:  Diagnosis Date  . Anginal pain (HCC)   . CAD (coronary artery disease)   . Gout   . High cholesterol   . Hypertension     Assessment: 66 yo man to start heparin for ACS/chest pain.  Wt 77.1 kg, creat 1.58, CBC WNL, trop negative. No anticoagulants PTA.   Goal of Therapy:  Heparin level 0.3-0.7 units/ml Monitor platelets by anticoagulation protocol: Yes   Plan: Give 4000 units bolus x 1 Start heparin infusion at 1000 units/hr Check anti-Xa level in 8 hours and daily while on heparin Continue to monitor H&H and platelets  Herby AbrahamMichelle T. Johna Kearl, Pharm.D. [phone] 07/04/2016 11:00 AM

## 2016-07-04 NOTE — ED Notes (Signed)
Attempted Report X2.  

## 2016-07-04 NOTE — H&P (Signed)
Referring Physician:  [name] is an 66 y.o. male.                       Chief Complaint: Chest pain  HPI: 66 year old male presented with left sided squeezing pain without shortness of breath or sweating spell or radiation but improving with SL NTG use. No fever, chills or cough. He has known history of CAD and balloon angioplasty.  Past Medical History:  Diagnosis Date  . Anginal pain ([place])   . CAD (coronary artery disease)   . Gout   . High cholesterol   . Hypertension       Past Surgical History:  Procedure Laterality Date  . CORONARY ANGIOPLASTY WITH STENT PLACEMENT  2000; 2007   "1 + 2" (03/04/2015)  . LEFT HEART CATHETERIZATION WITH CORONARY ANGIOGRAM N/A 02/04/2013   Procedure: LEFT HEART CATHETERIZATION WITH CORONARY ANGIOGRAM;  Surgeon: Donald Riddle, MD;  Location: [facility];  Service: Cardiovascular;  Laterality: N/A;  . LEFT HEART CATHETERIZATION WITH CORONARY ANGIOGRAM N/A 02/11/2013   Procedure: LEFT HEART CATHETERIZATION WITH CORONARY ANGIOGRAM;  Surgeon: Donald Riddle, MD;  Location: [facility];  Service: Cardiovascular;  Laterality: N/A;  . LEFT HEART CATHETERIZATION WITH CORONARY ANGIOGRAM N/A 11/12/2013   Procedure: LEFT HEART CATHETERIZATION WITH CORONARY ANGIOGRAM;  Surgeon: Donald Riddle, MD;  Location: [facility];  Service: Cardiovascular;  Laterality: N/A;  . PERCUTANEOUS CORONARY INTERVENTION-BALLOON ONLY  02/04/2013   Procedure: PERCUTANEOUS CORONARY INTERVENTION-BALLOON ONLY;  Surgeon: Donald Riddle, MD;  Location: [facility];  Service: Cardiovascular;;    No family history on file. Social History:  reports that he has never smoked. He has never used smokeless tobacco. He reports that he drinks about 7.2 oz of alcohol per week . He reports that he does not use drugs.  Allergies: No Known Allergies   (Not in a hospital admission)  Results for orders placed or performed during the hospital encounter of 07/04/16 (from the past 48 hour(s))   Basic metabolic panel     Status: Abnormal   Collection Time: 07/04/16  8:07 AM  Result Value Ref Range   Sodium 138 135 - 145 mmol/L   Potassium 4.4 3.5 - 5.1 mmol/L   Chloride 103 101 - 111 mmol/L   CO2 26 22 - 32 mmol/L   Glucose, Bld 116 (H) 65 - 99 mg/dL   BUN 15 6 - 20 mg/dL   Creatinine, Ser 1.58 (H) 0.61 - 1.24 mg/dL   Calcium 9.8 8.9 - 10.3 mg/dL   GFR calc non Af Amer 44 (L) >60 mL/min   GFR calc Af Amer 51 (L) >60 mL/min    Comment: (NOTE) The eGFR has been calculated using the CKD EPI equation. This calculation has not been validated in all clinical situations. eGFR's persistently <60 mL/min signify possible Chronic Kidney Disease.    Anion gap 9 5 - 15  CBC     Status: None   Collection Time: 07/04/16  8:07 AM  Result Value Ref Range   WBC 7.6 4.0 - 10.5 K/uL   RBC 4.94 4.22 - 5.81 MIL/uL   Hemoglobin 15.0 13.0 - 17.0 g/dL   HCT 44.8 39.0 - 52.0 %   MCV 90.7 78.0 - 100.0 fL   MCH 30.4 26.0 - 34.0 pg   MCHC 33.5 30.0 - 36.0 g/dL   RDW 13.0 11.5 - 15.5 %   Platelets 350 150 - 400 K/uL  I-stat troponin, ED     Status: None   Collection Time: 07/04/16  8:15 AM  Result Value Ref Range   Troponin i, poc 0.01 0.00 - 0.08 ng/mL   Comment 3            Comment: Due to the release kinetics of cTnI, a negative result within the first hours of the onset of symptoms does not rule out myocardial infarction with certainty. If myocardial infarction is still suspected, repeat the test at appropriate intervals.    Dg Chest 2 View  Result Date: 07/04/2016 CLINICAL DATA:  Left-sided chest pain and pressure. EXAM: CHEST  2 VIEW COMPARISON:  03/16/2016. FINDINGS: Trachea is midline. Heart is at the upper limits of normal in size to mildly enlarged. Lungs are somewhat low in volume but clear. No pleural fluid. IMPRESSION: No acute findings. Electronically Signed   By: Lorin Picket M.D.   On: 07/04/2016 08:43    Review Of Systems Constitutional: No fever, chills , weight  loss or gain. Eyes: No vision change, Wears glasses. No discharge or pain.. Ears: No hearing loss, No tinnitus. Respiratory: No asthma, COPD, pneumonias or shortness of breath. No hemoptysis. Cardiovascular:  Positive chest pain, palpitation or leg edema. Gastrointestinal: No nausea, vomiting or diarrhea or constipation. No GI bleed. No hepatitis. Genitourinary: No dysuria, hematuria or kidney stone. No incontinance. Neurological: No headache, stroke or seizures.  Psychiatry: No psych facility admission for anxiety, depression or suicide. No detox. Skin: No rash. Musculoskeletal: Positive joint pain. No fibromyalgia. No neck pain or back pain. Lymphadenopathy: No lymphadenopathy. Hematology: No anemia or easy bruising.   Blood pressure 140/87, pulse 71, temperature 97.7 F (36.5 C), temperature source Oral, resp. rate 16, height 5' 7"  (1.702 m), weight 77.1 kg (170 lb), SpO2 94 %. Body mass index is 26.63 kg/m. General appearance: alert, cooperative, appears stated age and no distress Head: Normocephalic, atraumatic. Eyes: Pink conjunctivae/corneas clear. PERRL, EOM's intact.  Neck: no adenopathy, no carotid bruit, no JVD, supple, symmetrical, trachea midline and thyroid not enlarged. Resp: clear to auscultation bilaterally. Chest wall non-tender on palpation. Cardio: regular rate and rhythm, S1, S2 normal, II/VI systolic murmur, no click, rub or gallop GI: soft, non-tender; bowel sounds normal; no masses,  no organomegaly Extremities: extremities normal, atraumatic, no cyanosis or edema Skin: Warm and dry. No rashes or lesions Neurologic: Alert and oriented X 3, normal strength and tone. Normal coordination and gait.  Assessment/Plan Unstable angina CAD S/P stent in LAD and diagonal arteries of heart  Place in observation. Nuclear stress test or cardiac cath in AM.  Donald Riddle, MD  07/04/2016, 10:37 AM

## 2016-07-04 NOTE — ED Triage Notes (Signed)
Per pt states he woke up with L sided squeezing CP since this morning. Hx of stents. Pt in NAD.

## 2016-07-04 NOTE — ED Provider Notes (Signed)
[facility]-EMERGENCY DEPT Provider Note   CSN: [id number] Arrival date & time: 07/04/16  0759     History   Chief Complaint Chief Complaint  Patient presents with  . Chest Pain    HPI [name] is a 66 y.o. male with PMH CAD with stent, HTN, HLD who presents with left chest pressure.   HPI Patient reports of chest pressure/tightness that began this morning after he woke up. Has some radiation of pain to his arm pit. No associated nausea, diaphoresis or shortness of breath. Pain has been constant with severity 2/10. Symptoms not worsening with ambulation. No history of DVT/PE, no LE swelling or calf pain, no long drives/plane rides, no prolonged bed rest recently. No recent illness with cough, rhinorrhea, or shortness of breath.   Past Medical History:  Diagnosis Date  . Anginal pain (HCC)   . CAD (coronary artery disease)   . Gout   . High cholesterol   . Hypertension     Patient Active Problem List   Diagnosis Date Noted  . Chest pain 07/04/2016  . Unstable angina (HCC) 07/04/2016  . Acute coronary syndrome (HCC) 03/04/2015  . Chest pain at rest 11/11/2013  . CAD (coronary artery disease)     Past Surgical History:  Procedure Laterality Date  . CORONARY ANGIOPLASTY WITH STENT PLACEMENT  2000; 2007   "1 + 2" (03/04/2015)  . LEFT HEART CATHETERIZATION WITH CORONARY ANGIOGRAM N/A 02/04/2013   Procedure: LEFT HEART CATHETERIZATION WITH CORONARY ANGIOGRAM;  Surgeon: [name], MD;  Location: [facility];  Service: Cardiovascular;  Laterality: N/A;  . LEFT HEART CATHETERIZATION WITH CORONARY ANGIOGRAM N/A 02/11/2013   Procedure: LEFT HEART CATHETERIZATION WITH CORONARY ANGIOGRAM;  Surgeon: [name], MD;  Location: [facility];  Service: Cardiovascular;  Laterality: N/A;  . LEFT HEART CATHETERIZATION WITH CORONARY ANGIOGRAM N/A 11/12/2013   Procedure: LEFT HEART CATHETERIZATION WITH CORONARY ANGIOGRAM;  Surgeon: [name], MD;  Location: [facility];  Service:  Cardiovascular;  Laterality: N/A;  . PERCUTANEOUS CORONARY INTERVENTION-BALLOON ONLY  02/04/2013   Procedure: PERCUTANEOUS CORONARY INTERVENTION-BALLOON ONLY;  Surgeon: [name], MD;  Location: [facility];  Service: Cardiovascular;;     Home Medications    Prior to Admission medications   Medication Sig Start Date End Date Taking? Authorizing Provider  allopurinol (ZYLOPRIM) 100 MG tablet Take 100 mg by mouth daily. 05/31/16  Yes Historical Provider, MD  amLODipine (NORVASC) 5 MG tablet Take 5 mg by mouth every evening.    Yes Historical Provider, MD  aspirin EC 81 MG tablet Take 81 mg by mouth daily.   Yes Historical Provider, MD  clopidogrel (PLAVIX) 75 MG tablet Take 1 tablet (75 mg total) by mouth daily with breakfast. 03/09/13  Yes Rinaldo Cloud, MD  colchicine 0.6 MG tablet Take 0.6 mg by mouth daily as needed (for gout).  05/31/16  Yes Historical Provider, MD  famotidine (PEPCID) 20 MG tablet Take 1 tablet (20 mg total) by mouth 2 (two) times daily. Patient taking differently: Take 20 mg by mouth 2 (two) times daily as needed for heartburn.  03/09/13  Yes Rinaldo Cloud, MD  isosorbide mononitrate (IMDUR) 60 MG 24 hr tablet Take 60 mg by mouth 2 (two) times daily.   Yes Historical Provider, MD  lisinopril (PRINIVIL,ZESTRIL) 10 MG tablet Take 1 tablet (10 mg total) by mouth daily. 03/05/15  Yes Orpah Cobb, MD  metoprolol tartrate (LOPRESSOR) 25 MG tablet Take 0.5 tablets (12.5 mg total) by  mouth 2 (two) times daily. 03/05/15  Yes Orpah Cobb, MD  pravastatin (PRAVACHOL) 40 MG tablet Take 1 tablet (40 mg total) by mouth every evening. Patient taking differently: Take 40 mg by mouth every morning.  03/05/15  Yes Orpah Cobb, MD  ALPRAZolam Prudy Feeler) 0.25 MG tablet Take 1 tablet (0.25 mg total) by mouth 2 (two) times daily as needed for anxiety. 03/05/15   Orpah Cobb, MD  nitroGLYCERIN (NITROSTAT) 0.4 MG SL tablet Place 1 tablet (0.4 mg total) under the tongue every 5 (five) minutes as  needed for chest pain. 11/12/13   Orpah Cobb, MD    Family History No family history on file.  Social History Social History  Substance Use Topics  . Smoking status: Never Smoker  . Smokeless tobacco: Never Used  . Alcohol use 7.2 oz/week    12 Cans of beer per week     Comment: 03/04/2015 "couple beers, 4-5 times/wk; 12 pack/wk"     Allergies   Patient has no known allergies.   Review of Systems Review of Systems  Constitutional: Negative for chills and fever.  HENT: Negative for rhinorrhea, sinus pressure and sore throat.   Respiratory: Positive for chest tightness. Negative for cough and shortness of breath.   Cardiovascular: Negative for palpitations and leg swelling.  Gastrointestinal: Negative for abdominal pain.  Neurological: Negative for numbness.     Physical Exam Updated Vital Signs BP 126/80   Pulse 60   Temp 97.7 F (36.5 C) (Oral)   Resp 13   Ht 5\' 7"  (1.702 m)   Wt 77.1 kg   SpO2 96%   BMI 26.63 kg/m   Physical Exam  Constitutional: He is oriented to person, place, and time. He appears well-developed and well-nourished. No distress.  HENT:  Mouth/Throat: Oropharynx is clear and moist.  Eyes: Conjunctivae are normal.  Neck: No JVD present.  Cardiovascular: Normal rate, regular rhythm and normal heart sounds.   Pulmonary/Chest: Effort normal and breath sounds normal. No respiratory distress. He has no rales.  Abdominal: Soft. He exhibits no distension. There is no tenderness.  Musculoskeletal: He exhibits no edema or tenderness.  Neurological: He is alert and oriented to person, place, and time. No cranial nerve deficit.  Skin: Skin is warm and dry. Capillary refill takes less than 2 seconds.     ED Treatments / Results  Labs (all labs ordered are listed, but only abnormal results are displayed) Labs Reviewed  BASIC METABOLIC PANEL - Abnormal; Notable for the following:       Result Value   Glucose, Bld 116 (*)    Creatinine, Ser 1.58 (*)     GFR calc non Af Amer 44 (*)    GFR calc Af Amer 51 (*)    All other components within normal limits  CBC  TROPONIN I  TROPONIN I  TROPONIN I  HEPARIN LEVEL (UNFRACTIONATED)  I-STAT TROPOININ, ED    EKG  EKG Interpretation  Date/Time:  Wednesday July 04 2016 08:05:00 EST Ventricular Rate:  74 PR Interval:  174 QRS Duration: 82 QT Interval:  386 QTC Calculation: 428 R Axis:   -49 Text Interpretation:  Normal sinus rhythm Left anterior fascicular block Possible Lateral infarct , age undetermined Abnormal ECG No significant change since last tracing Confirmed by FLOYD MD, DANIEL [id number]) on 07/04/2016 9:34:36 AM       Radiology Dg Chest 2 View  Result Date: 07/04/2016 CLINICAL DATA:  Left-sided chest pain and pressure. EXAM: CHEST  2 VIEW COMPARISON:  03/16/2016. FINDINGS: Trachea is midline. Heart is at the upper limits of normal in size to mildly enlarged. Lungs are somewhat low in volume but clear. No pleural fluid. IMPRESSION: No acute findings. Electronically Signed   By: Leanna BattlesMelinda  Blietz M.D.   On: 07/04/2016 08:43    Procedures Procedures (including critical care time)  Medications Ordered in ED Medications  nitroGLYCERIN (NITROSTAT) SL tablet 0.4 mg (0.4 mg Sublingual Given 07/04/16 1014)  aspirin EC tablet 81 mg (not administered)  acetaminophen (TYLENOL) tablet 650 mg (not administered)  ondansetron (ZOFRAN) injection 4 mg (not administered)  allopurinol (ZYLOPRIM) tablet 100 mg (not administered)  ALPRAZolam (XANAX) tablet 0.25 mg (not administered)  amLODipine (NORVASC) tablet 5 mg (not administered)  clopidogrel (PLAVIX) tablet 75 mg (not administered)  famotidine (PEPCID) tablet 20 mg (not administered)  isosorbide mononitrate (IMDUR) 24 hr tablet 60 mg (not administered)  lisinopril (PRINIVIL,ZESTRIL) tablet 10 mg (not administered)  metoprolol tartrate (LOPRESSOR) tablet 12.5 mg (not administered)  pravastatin (PRAVACHOL) tablet 40 mg (not administered)   heparin ADULT infusion 100 units/mL (25000 units/22250mL sodium chloride 0.45%) (1,000 Units/hr Intravenous New Bag/Given 07/04/16 1155)  aspirin chewable tablet 324 mg (324 mg Oral Given 07/04/16 1008)  heparin bolus via infusion 4,000 Units (4,000 Units Intravenous Bolus from Bag 07/04/16 1158)     Initial Impression / Assessment and Plan / ED Course  I have reviewed the triage vital signs and the nursing notes.  Pertinent labs & imaging results that were available during my care of the patient were reviewed by me and considered in my medical decision making (see chart for details).  Patient with history of stent with left chest pressure. Vitals are stable. ASA 324mg  given and nitro SL ordered. EKG unchanged from prior. istat trop 0.01. CXR unremarkable. CBC and BMP overall unremarkable except for mild increase in creatinine to 1.58.   9:57AM: Spoke with Dr. Algie CofferKadakia who reports that he will admit patient to observation. Will place temporary orders.   Final Clinical Impressions(s) / ED Diagnoses   Final diagnoses:  Chest pain, unspecified type    New Prescriptions New Prescriptions   No medications on file     Palma HolterKanishka G Berlyn Saylor, MD 07/04/16 1512    Melene Planan Floyd, DO 07/04/16 1518

## 2016-07-05 ENCOUNTER — Observation Stay (HOSPITAL_COMMUNITY): Payer: Medicare Other

## 2016-07-05 DIAGNOSIS — I444 Left anterior fascicular block: Secondary | ICD-10-CM | POA: Diagnosis not present

## 2016-07-05 DIAGNOSIS — Z955 Presence of coronary angioplasty implant and graft: Secondary | ICD-10-CM | POA: Diagnosis not present

## 2016-07-05 DIAGNOSIS — E785 Hyperlipidemia, unspecified: Secondary | ICD-10-CM | POA: Diagnosis not present

## 2016-07-05 DIAGNOSIS — I25110 Atherosclerotic heart disease of native coronary artery with unstable angina pectoris: Secondary | ICD-10-CM | POA: Diagnosis not present

## 2016-07-05 LAB — CBC
HEMATOCRIT: 40.4 % (ref 39.0–52.0)
HEMOGLOBIN: 13.8 g/dL (ref 13.0–17.0)
MCH: 30.5 pg (ref 26.0–34.0)
MCHC: 34.2 g/dL (ref 30.0–36.0)
MCV: 89.2 fL (ref 78.0–100.0)
Platelets: 332 10*3/uL (ref 150–400)
RBC: 4.53 MIL/uL (ref 4.22–5.81)
RDW: 12.8 % (ref 11.5–15.5)
WBC: 8.1 10*3/uL (ref 4.0–10.5)

## 2016-07-05 LAB — LIPID PANEL
CHOL/HDL RATIO: 4 ratio
Cholesterol: 198 mg/dL (ref 0–200)
HDL: 49 mg/dL (ref >40)
LDL CALC: 131 mg/dL — AB (ref 0–99)
TRIGLYCERIDES: 91 mg/dL (ref <150)
VLDL: 18 mg/dL (ref 0–40)

## 2016-07-05 LAB — BASIC METABOLIC PANEL
ANION GAP: 9 (ref 5–15)
BUN: 14 mg/dL (ref 6–20)
CO2: 25 mmol/L (ref 22–32)
Calcium: 9.3 mg/dL (ref 8.9–10.3)
Chloride: 101 mmol/L (ref 101–111)
Creatinine, Ser: 1.3 mg/dL — ABNORMAL HIGH (ref 0.61–1.24)
GFR calc Af Amer: >60 mL/min (ref >60)
GFR calc non Af Amer: 55 mL/min — ABNORMAL LOW (ref >60)
Glucose, Bld: 112 mg/dL — ABNORMAL HIGH (ref 65–99)
POTASSIUM: 4.2 mmol/L (ref 3.5–5.1)
Sodium: 135 mmol/L (ref 135–145)

## 2016-07-05 LAB — PROTIME-INR
INR: 0.99
Prothrombin Time: 13 seconds (ref 11.4–15.2)

## 2016-07-05 LAB — HEPARIN LEVEL (UNFRACTIONATED): Heparin Unfractionated: 0.47 IU/mL (ref 0.30–0.70)

## 2016-07-05 MED ORDER — REGADENOSON 0.4 MG/5ML IV SOLN
INTRAVENOUS | Status: AC
  Administered 2016-07-05: 0.4 mg via INTRAVENOUS
  Filled 2016-07-05: qty 5

## 2016-07-05 MED ORDER — REGADENOSON 0.4 MG/5ML IV SOLN
0.4000 mg | Freq: Once | INTRAVENOUS | Status: AC
Start: 2016-07-05 — End: 2016-07-05
  Administered 2016-07-05: 0.4 mg via INTRAVENOUS
  Filled 2016-07-05: qty 5

## 2016-07-05 MED ORDER — ALUM & MAG HYDROXIDE-SIMETH 200-200-20 MG/5ML PO SUSP
15.0000 mL | Freq: Once | ORAL | Status: AC
  Administered 2016-07-05: 15 mL via ORAL
  Filled 2016-07-05: qty 30

## 2016-07-05 MED ORDER — ALUM & MAG HYDROXIDE-SIMETH 200-200-20 MG/5ML PO SUSP
30.0000 mL | ORAL | Status: DC | PRN
  Administered 2016-07-05: 30 mL via ORAL
  Filled 2016-07-05: qty 30

## 2016-07-05 MED ORDER — TECHNETIUM TC 99M TETROFOSMIN IV KIT: 30.0000 | PACK | Freq: Once | INTRAVENOUS | Status: DC | PRN

## 2016-07-05 MED ORDER — TECHNETIUM TC 99M TETROFOSMIN IV KIT
10.0000 | PACK | Freq: Once | INTRAVENOUS | Status: AC | PRN
  Administered 2016-07-05: 10 via INTRAVENOUS

## 2016-07-05 NOTE — Progress Notes (Signed)
ANTICOAGULATION CONSULT NOTE  Pharmacy Consult for heparin Indication: chest pain/ACS  No Known Allergies  Patient Measurements: Height: 5\' 7"  (170.2 cm) Weight: 166 lb 8 oz (75.5 kg) IBW/kg (Calculated) : 66.1 Heparin Dosing Weight: 77.1 kg  Vital Signs: Temp: 98.5 F (36.9 C) (01/25 0610) Temp Source: Oral (01/25 0610) BP: 122/75 (01/25 0610) Pulse Rate: 74 (01/25 0610)  Labs:  Recent Labs  07/04/16 0807 07/04/16 1040 07/04/16 1554 07/04/16 1941 07/04/16 2216 07/05/16 0201  HGB 15.0  --   --   --   --  13.8  HCT 44.8  --   --   --   --  40.4  PLT 350  --   --   --   --  332  LABPROT  --   --   --   --   --  13.0  INR  --   --   --   --   --  0.99  HEPARINUNFRC  --   --   --  0.62  --  0.47  CREATININE 1.58*  --   --   --   --  1.30*  TROPONINI  --  <0.03 <0.03  --  <0.03  --     Estimated Creatinine Clearance: 51.6 mL/min (by C-G formula based on SCr of 1.3 mg/dL (H)).   Medical History: Past Medical History:  Diagnosis Date  . Anginal pain (HCC)   . Arthritis    "ribs; right elbow" (07/04/2016)  . CAD (coronary artery disease)   . GERD (gastroesophageal reflux disease)   . Gout   . High cholesterol   . Hypertension     Assessment: 66 yo man continuing on heparin for ACS/chest pain. CBC WNL, no bleeding documented. No anticoagulants PTA.   Heparin level remains therapeutic at 0.47 on heparin 1000 units/hr. No issues with infusion or bleeding noted.   Goal of Therapy:  Heparin level 0.3-0.7 units/ml Monitor platelets by anticoagulation protocol: Yes   Plan: Heparin infusion at 1000 units/hr Daily heparin level/CBC Monitor H&H and platelets, s/sx bleeding F/u stress test vs cath   Arlean Hoppingorey M. Newman PiesBall, PharmD, BCPS Clinical Pharmacist [phone] 07/05/2016 8:27 AM

## 2016-07-05 NOTE — Progress Notes (Signed)
Ref: Ricki RodriguezKADAKIA,Roldan Laforest S, MD   Subjective:  Some chest pain earlier. Feels well now. Discussed nuclear stress test result. Basal septum scar and inferolateral scar with EF 66 %.  Objective:  Vital Signs in the last 24 hours: Temp:  [97.9 F (36.6 C)-98.5 F (36.9 C)] 98.2 F (36.8 C) (01/25 1811) Pulse Rate:  [72-107] 77 (01/25 1811) Cardiac Rhythm: Normal sinus rhythm (01/25 1900) Resp:  [16-18] 16 (01/25 1811) BP: (114-149)/(70-95) 118/70 (01/25 1811) SpO2:  [96 %-99 %] 96 % (01/25 1811) Weight:  [75.5 kg (166 lb 8 oz)] 75.5 kg (166 lb 8 oz) (01/25 0610)  Physical Exam: BP Readings from Last 1 Encounters:  07/05/16 118/70    Wt Readings from Last 1 Encounters:  07/05/16 75.5 kg (166 lb 8 oz)    Weight change:  Body mass index is 26.08 kg/m. HEENT: [place]/AT, Eyes- PERL, EOMI, Conjunctiva-Pink, Sclera-Non-icteric Neck: No JVD, No bruit, Trachea midline. Lungs:  Clear, Bilateral. Cardiac:  Regular rhythm, normal S1 and S2, no S3. II/VI systolic murmur. Abdomen:  Soft, non-tender. BS present. Extremities:  No edema present. No cyanosis. No clubbing. CNS: AxOx3, Cranial nerves grossly intact, moves all 4 extremities.  Skin: Warm and dry.   Intake/Output from previous day: 01/24 0701 - 01/25 0700 In: 412.5 [P.O.:230; I.V.:182.5] Out: 875 [Urine:875]    Lab Results: BMET    Component Value Date/Time   NA 135 07/05/2016 0201   NA 138 07/04/2016 0807   NA 137 03/16/2016 1521   K 4.2 07/05/2016 0201   K 4.4 07/04/2016 0807   K 4.0 03/16/2016 1521   CL 101 07/05/2016 0201   CL 103 07/04/2016 0807   CL 106 03/16/2016 1521   CO2 25 07/05/2016 0201   CO2 26 07/04/2016 0807   CO2 23 03/16/2016 1521   GLUCOSE 112 (H) 07/05/2016 0201   GLUCOSE 116 (H) 07/04/2016 0807   GLUCOSE 105 (H) 03/16/2016 1521   BUN 14 07/05/2016 0201   BUN 15 07/04/2016 0807   BUN 15 03/16/2016 1521   CREATININE 1.30 (H) 07/05/2016 0201   CREATININE 1.58 (H) 07/04/2016 0807   CREATININE 1.24  03/16/2016 1521   CALCIUM 9.3 07/05/2016 0201   CALCIUM 9.8 07/04/2016 0807   CALCIUM 9.5 03/16/2016 1521   GFRNONAA 55 (L) 07/05/2016 0201   GFRNONAA 44 (L) 07/04/2016 0807   GFRNONAA 59 (L) 03/16/2016 1521   GFRAA >60 07/05/2016 0201   GFRAA 51 (L) 07/04/2016 0807   GFRAA >60 03/16/2016 1521   CBC    Component Value Date/Time   WBC 8.1 07/05/2016 0201   RBC 4.53 07/05/2016 0201   HGB 13.8 07/05/2016 0201   HCT 40.4 07/05/2016 0201   PLT 332 07/05/2016 0201   MCV 89.2 07/05/2016 0201   MCH 30.5 07/05/2016 0201   MCHC 34.2 07/05/2016 0201   RDW 12.8 07/05/2016 0201   LYMPHSABS 2.3 03/08/2013 2103   MONOABS 1.0 03/08/2013 2103   EOSABS 0.1 03/08/2013 2103   BASOSABS 0.0 03/08/2013 2103   HEPATIC Function Panel No results for input(s): PROT in the last 8760 hours.  Invalid input(s):  ALBUMIN,  AST,  ALT,  ALKPHOS,  BILIDIR,  IBILI HEMOGLOBIN A1C No components found for: HGA1C,  MPG CARDIAC ENZYMES Lab Results  Component Value Date   CKTOTAL 71 07/10/2007   CKMB 1.0 07/10/2007   TROPONINI <0.03 07/04/2016   TROPONINI <0.03 07/04/2016   TROPONINI <0.03 07/04/2016   BNP No results for input(s): PROBNP in the last 8760 hours. TSH  No results for input(s): TSH in the last 8760 hours. CHOLESTEROL  Recent Labs  07/05/16 0201  CHOL 198    Scheduled Meds: . allopurinol  100 mg Oral Daily  . amLODipine  5 mg Oral QPM  . aspirin EC  81 mg Oral Daily  . clopidogrel  75 mg Oral Q breakfast  . famotidine  20 mg Oral BID  . isosorbide mononitrate  60 mg Oral BID  . lisinopril  10 mg Oral Daily  . metoprolol tartrate  12.5 mg Oral BID  . pravastatin  40 mg Oral QPM   Continuous Infusions: . heparin 1,000 Units/hr (07/05/16 0803)   PRN Meds:.acetaminophen, ALPRAZolam, alum & mag hydroxide-simeth, nitroGLYCERIN, ondansetron (ZOFRAN) IV, technetium tetrofosmin  Assessment/Plan: Recurrent chest pain CAD S/P stent in LAD.  Echocardiogram in AM to check wall motion  of septum and inferolateral wall. Increase Imdur dose if needed.   LOS: 0 days    Orpah Cobb  MD  07/05/2016, 8:44 PM

## 2016-07-05 NOTE — Progress Notes (Signed)
Patient complained of indigestion/chest pain 5/10. Patient stated that "it feels like heart burn". Wanted Maalox instead on Nitro. Will continue to monitor.

## 2016-07-05 NOTE — Progress Notes (Signed)
Order received at 1100 from Dr. Algie CofferKadakia to resume pt's diet and ok to give his meds.when back from stress test..  Amanda PeaNellie Braylinn Gulden, RN.

## 2016-07-06 ENCOUNTER — Observation Stay (HOSPITAL_COMMUNITY): Payer: Medicare Other

## 2016-07-06 LAB — ECHOCARDIOGRAM COMPLETE
AOPV: 0.91 m/s
AOVTI: 20.3 cm
AV Area VTI index: 1.63 cm2/m2
AV Area mean vel: 2.74 cm2
AV Mean grad: 2 mmHg
AV area mean vel ind: 1.46 cm2/m2
AV peak Index: 1.39
AVA: 3.04 cm2
AVAREAVTI: 2.59 cm2
AVCELMEANRAT: 0.96
AVLVOTPG: 4 mmHg
AVPG: 4 mmHg
AVPKVEL: 103 cm/s
CHL CUP AV VEL: 3.04
DOP CAL AO MEAN VELOCITY: 69.7 cm/s
E decel time: 303 msec
FS: 32 % (ref 28–44)
HEIGHTINCHES: 67 in
IV/PV OW: 1.09
LA ID, A-P, ES: 37 mm
LA diam end sys: 37 mm
LADIAMINDEX: 1.98 cm/m2
LAVOLA4C: 43.2 mL
LV TDI E'LATERAL: 9.9
LV TDI E'MEDIAL: 8.05
LVELAT: 9.9 cm/s
LVOT VTI: 21.7 cm
LVOT area: 2.84 cm2
LVOT diameter: 19 mm
LVOT peak VTI: 1.07 cm
LVOT peak vel: 94 cm/s
LVOTSV: 62 mL
MV Dec: 303
MV pk E vel: 0.9 m/s
PW: 11 mm — AB (ref 0.6–1.1)
RV LATERAL S' VELOCITY: 12.2 cm/s
RV TAPSE: 13.5 mm
Valve area index: 1.63
WEIGHTICAEL: 2672 [oz_av]

## 2016-07-06 LAB — CBC
HEMATOCRIT: 42.4 % (ref 39.0–52.0)
HEMOGLOBIN: 14.2 g/dL (ref 13.0–17.0)
MCH: 30.1 pg (ref 26.0–34.0)
MCHC: 33.5 g/dL (ref 30.0–36.0)
MCV: 90 fL (ref 78.0–100.0)
Platelets: 328 10*3/uL (ref 150–400)
RBC: 4.71 MIL/uL (ref 4.22–5.81)
RDW: 13.2 % (ref 11.5–15.5)
WBC: 8.7 10*3/uL (ref 4.0–10.5)

## 2016-07-06 LAB — HEPARIN LEVEL (UNFRACTIONATED): HEPARIN UNFRACTIONATED: 0.48 [IU]/mL (ref 0.30–0.70)

## 2016-07-06 MED ORDER — FAMOTIDINE 20 MG PO TABS: 20.0000 mg | ORAL_TABLET | Freq: Two times a day (BID) | ORAL | Status: AC | PRN

## 2016-07-06 NOTE — Progress Notes (Signed)
ANTICOAGULATION CONSULT NOTE  Pharmacy Consult for heparin Indication: chest pain/ACS  No Known Allergies  Patient Measurements: Height: 5\' 7"  (170.2 cm) Weight: 167 lb (75.8 kg) (scale a) IBW/kg (Calculated) : 66.1 Heparin Dosing Weight: 77.1 kg  Vital Signs: Temp: 98.2 F (36.8 C) (01/26 0447) Temp Source: Oral (01/26 0447) BP: 131/75 (01/26 0447) Pulse Rate: 69 (01/26 0447)  Labs:  Recent Labs  07/04/16 0807 07/04/16 1040 07/04/16 1554 07/04/16 1941 07/04/16 2216 07/05/16 0201 07/06/16 0437  HGB 15.0  --   --   --   --  13.8 14.2  HCT 44.8  --   --   --   --  40.4 42.4  PLT 350  --   --   --   --  332 328  LABPROT  --   --   --   --   --  13.0  --   INR  --   --   --   --   --  0.99  --   HEPARINUNFRC  --   --   --  0.62  --  0.47 0.48  CREATININE 1.58*  --   --   --   --  1.30*  --   TROPONINI  --  <0.03 <0.03  --  <0.03  --   --     Estimated Creatinine Clearance: 51.6 mL/min (by C-G formula based on SCr of 1.3 mg/dL (H)).   Medical History: Past Medical History:  Diagnosis Date  . Anginal pain (HCC)   . Arthritis    "ribs; right elbow" (07/04/2016)  . CAD (coronary artery disease)   . GERD (gastroesophageal reflux disease)   . Gout   . High cholesterol   . Hypertension     Assessment: 66 yo man continuing on heparin for ACS/chest pain. CBC WNL, no bleeding documented. No anticoagulants PTA.   Heparin level remains therapeutic at 0.48 on heparin 1000 units/hr. No issues with infusion or bleeding noted.   Goal of Therapy:  Heparin level 0.3-0.7 units/ml Monitor platelets by anticoagulation protocol: Yes   Plan: Heparin infusion at 1000 units/hr Daily heparin level/CBC Monitor H&H and platelets, s/sx bleeding   Arlean Hoppingorey M. Newman PiesBall, PharmD, BCPS Clinical Pharmacist [phone]#[id number] 07/06/2016 8:13 AM

## 2016-07-06 NOTE — Discharge Summary (Signed)
Physician Discharge Summary  Patient ID: [name] MRN: [id number] DOB/AGE: [birth year] 66 y.o.  Admit date: 07/04/2016 Discharge date: 07/06/2016  Admission Diagnoses: Recurrent chest pain CAD S/P stent in LAD  Discharge Diagnoses:  Principle problem: * Chest pain * Active Problems:   CAD   S/P stent in LAD   Dyslipidemia    Discharged Condition: fair  Hospital Course: 66 year old male with history of LAD stent and hypertension has recurrent chest pain. His nuclear stress test was negative for reversible ischemia. His echocardiogram showed preserved LV systolic function. He will resume Plavix and NTG SL as needed along with B-blocker and isosorbide. He will see me in 1 week. Will add Renolazine if he still symptomatic.   Consults: cardiology  Significant Diagnostic Studies: labs: CBC-WNL, BMET- near normal except creatinine 1.58 and glucose 116 mg. Troponin-I negative x 3. LDL cholesterol 131 and HDL -49 with triglyceride of 91.  EKG-NSR, Left anterior fascicular block, possible lateral infarct.  Chest X-ray: Unremarkable.  Nuclear stress test: Normal EF and no reversible ischemia with possible basal septal and inferolateral wall scar.  Echocardiogram: Normal LV systolic function with mild diastolic dysfunction.  Treatments: cardiac meds: lisinopril (generic), metoprolol, amlodipine, Pravastatin, aspirin, Plavix and Imdur.  Discharge Exam: Blood pressure 124/78, pulse 65, temperature 98.2 F (36.8 C), temperature source Oral, resp. rate 18, height 5\' 7"  (1.702 m), weight 75.8 kg (167 lb), SpO2 97 %. General appearance: alert, cooperative and appears stated age Head: Normocephalic, atraumatic. Eyes: pink conjunctivae/corneas clear. PERRL, EOM's intact.  Neck: no adenopathy, no carotid bruit, no JVD, supple, symmetrical, trachea midline and thyroid not enlarged. Resp: clear to auscultation bilaterally. Cardio: regular rate and rhythm, S1, S2 normal, no murmur, click,  rub or gallop GI: soft, non-tender; bowel sounds normal; no masses,  no organomegaly Extremities: extremities normal, atraumatic, no cyanosis or edema Skin: Warm and dry. No rashes or lesions Neurologic: Alert and oriented X 3, normal strength and tone. Normal coordination and gait.  Disposition: 01-Home or Self Care   Allergies as of 07/06/2016   No Known Allergies     Medication List    TAKE these medications   allopurinol 100 MG tablet Commonly known as:  ZYLOPRIM Take 100 mg by mouth daily.   ALPRAZolam 0.25 MG tablet Commonly known as:  XANAX Take 1 tablet (0.25 mg total) by mouth 2 (two) times daily as needed for anxiety.   amLODipine 5 MG tablet Commonly known as:  NORVASC Take 5 mg by mouth every evening.   aspirin EC 81 MG tablet Take 81 mg by mouth daily.   clopidogrel 75 MG tablet Commonly known as:  PLAVIX Take 1 tablet (75 mg total) by mouth daily with breakfast.   colchicine 0.6 MG tablet Take 0.6 mg by mouth daily as needed (for gout).   famotidine 20 MG tablet Commonly known as:  PEPCID Take 1 tablet (20 mg total) by mouth 2 (two) times daily as needed for heartburn.   isosorbide mononitrate 60 MG 24 hr tablet Commonly known as:  IMDUR Take 60 mg by mouth 2 (two) times daily.   lisinopril 10 MG tablet Commonly known as:  PRINIVIL,ZESTRIL Take 1 tablet (10 mg total) by mouth daily.   metoprolol tartrate 25 MG tablet Commonly known as:  LOPRESSOR Take 0.5 tablets (12.5 mg total) by mouth 2 (two) times daily.   nitroGLYCERIN 0.4 MG SL tablet Commonly known as:  NITROSTAT Place 1 tablet (0.4 mg total) under the tongue every 5 (  five) minutes as needed for chest pain.   pravastatin 40 MG tablet Commonly known as:  PRAVACHOL Take 1 tablet (40 mg total) by mouth every evening. What changed:  when to take this      Follow-up Information    Ricki RodriguezKADAKIA,Luane Rochon S, MD On 07/12/2016.   Specialty:  Cardiology Why:  AT 2:30PM FOR A HOSPITAL FOLLOW UP APPT.   Contact information: [address] [place] [address] [phone]           Signed: Ricki RodriguezKADAKIA,Armarion Greek S 07/06/2016, 11:09 AM

## 2016-07-06 NOTE — Progress Notes (Signed)
Pt is alert and oriented, no distress, pt MD rounded and states that he can go home today.

## 2016-07-06 NOTE — Progress Notes (Signed)
Called to clarify that Nitroglycerin order was [facility] and he needed more clarification on frequency for medication. Gave him Doctor Algie CofferKadakia number to clarify.

## 2016-08-11 ENCOUNTER — Encounter (HOSPITAL_COMMUNITY): Payer: Self-pay | Admitting: Emergency Medicine

## 2016-08-11 ENCOUNTER — Emergency Department (HOSPITAL_COMMUNITY): Payer: Medicare Other

## 2016-08-11 ENCOUNTER — Emergency Department (HOSPITAL_COMMUNITY)
Admission: EM | Admit: 2016-08-11 | Discharge: 2016-08-11 | Disposition: A | Discharge status: Home / Self Care | Payer: Medicare Other | Attending: Emergency Medicine | Admitting: Emergency Medicine

## 2016-08-11 DIAGNOSIS — Z79899 Other long term (current) drug therapy: Secondary | ICD-10-CM | POA: Insufficient documentation

## 2016-08-11 DIAGNOSIS — R079 Chest pain, unspecified: Secondary | ICD-10-CM | POA: Diagnosis present

## 2016-08-11 DIAGNOSIS — Z955 Presence of coronary angioplasty implant and graft: Secondary | ICD-10-CM | POA: Diagnosis not present

## 2016-08-11 DIAGNOSIS — I2510 Atherosclerotic heart disease of native coronary artery without angina pectoris: Secondary | ICD-10-CM | POA: Diagnosis not present

## 2016-08-11 DIAGNOSIS — Z7982 Long term (current) use of aspirin: Secondary | ICD-10-CM | POA: Diagnosis not present

## 2016-08-11 DIAGNOSIS — I10 Essential (primary) hypertension: Secondary | ICD-10-CM | POA: Diagnosis not present

## 2016-08-11 LAB — CBC
HEMATOCRIT: 42 % (ref 39.0–52.0)
Hemoglobin: 14.5 g/dL (ref 13.0–17.0)
MCH: 30.5 pg (ref 26.0–34.0)
MCHC: 34.5 g/dL (ref 30.0–36.0)
MCV: 88.4 fL (ref 78.0–100.0)
PLATELETS: 313 10*3/uL (ref 150–400)
RBC: 4.75 MIL/uL (ref 4.22–5.81)
RDW: 12.5 % (ref 11.5–15.5)
WBC: 7.7 10*3/uL (ref 4.0–10.5)

## 2016-08-11 LAB — BASIC METABOLIC PANEL
Anion gap: 11 (ref 5–15)
BUN: 13 mg/dL (ref 6–20)
CALCIUM: 9.2 mg/dL (ref 8.9–10.3)
CO2: 21 mmol/L — ABNORMAL LOW (ref 22–32)
CREATININE: 1.33 mg/dL — AB (ref 0.61–1.24)
Chloride: 101 mmol/L (ref 101–111)
GFR calc Af Amer: >60 mL/min (ref >60)
GFR, EST NON AFRICAN AMERICAN: 54 mL/min — AB (ref >60)
GLUCOSE: 109 mg/dL — AB (ref 65–99)
POTASSIUM: 3.8 mmol/L (ref 3.5–5.1)
Sodium: 133 mmol/L — ABNORMAL LOW (ref 135–145)

## 2016-08-11 LAB — I-STAT TROPONIN, ED
Troponin i, poc: 0 ng/mL (ref 0.00–0.08)
Troponin i, poc: 0 ng/mL (ref 0.00–0.08)

## 2016-08-11 NOTE — ED Triage Notes (Signed)
Per [facility]: Pt to ED from home c/o central CP without radiation since 1700 today. Was admitted for OBS and stress test in January - everything came back normal, does have hx of 3 stents and angioplasty. Pt took 324 ASA and 3 NTG at home with little relief and told by PCP to call EMS if it didn't go away after that. EMS gave pt another 3 NTG en route. EMS VS: 150/100, HR 90 sinus with RBBB, 96% RA, CBG 130. 20g. LAC. Pt A&O x 4, denies SOB/dizziness/N/V. Skin warm/dry, resp e/u.

## 2016-08-11 NOTE — ED Notes (Signed)
Patient returned to room and placed back on monitor.  

## 2016-08-11 NOTE — ED Provider Notes (Signed)
[facility]-EMERGENCY DEPT Provider Note   CSN: [id number] Arrival date & time: 08/11/16  1809     History   Chief Complaint Chief Complaint  Patient presents with  . Chest Pain    HPI [name] is a 66 y.o. male.  He developed chest pain around 5:30 PM while watching TV.  He took 3 sublingual nitroglycerin, and 4 baby aspirin, without relief.  He called EMS who arrived and began treatment and transport.  He was given another sublingual nitroglycerin, and his pain began to diminish, by the time he received a second nitroglycerin by EMS it was gone.  He received a total of 3 nitroglycerin by EMS.  He had some mild nausea but no diaphoresis or shortness of breath with the pain.  He has been taking nitroglycerin a couple of times a week, since hospital discharge 2 months ago.  Usually the nitroglycerin takes away his chest pain.  Today he became concerned because it did not therefore called EMS.  He saw his cardiologist in follow-up after hospitalization 1 month ago.  No other recent illnesses.  There are no other no modifying factors.  HPI  Past Medical History:  Diagnosis Date  . Anginal pain (HCC)   . Arthritis    "ribs; right elbow" (07/04/2016)  . CAD (coronary artery disease)   . GERD (gastroesophageal reflux disease)   . Gout   . High cholesterol   . Hypertension     Patient Active Problem List   Diagnosis Date Noted  . Chest pain 07/04/2016  . Unstable angina (HCC) 07/04/2016  . Acute coronary syndrome (HCC) 03/04/2015  . Chest pain at rest 11/11/2013  . CAD (coronary artery disease)     Past Surgical History:  Procedure Laterality Date  . CORONARY ANGIOPLASTY WITH STENT PLACEMENT  2000; 2007   "1 + 2" (03/04/2015)  . LEFT HEART CATHETERIZATION WITH CORONARY ANGIOGRAM N/A 02/04/2013   Procedure: LEFT HEART CATHETERIZATION WITH CORONARY ANGIOGRAM;  Surgeon: [name], MD;  Location: [facility] CATH LAB;  Service: Cardiovascular;  Laterality: N/A;  . LEFT HEART  CATHETERIZATION WITH CORONARY ANGIOGRAM N/A 02/11/2013   Procedure: LEFT HEART CATHETERIZATION WITH CORONARY ANGIOGRAM;  Surgeon: [name], MD;  Location: [facility] CATH [facility];  Service: Cardiovascular;  Laterality: N/A;  . LEFT HEART CATHETERIZATION WITH CORONARY ANGIOGRAM N/A 11/12/2013   Procedure: LEFT HEART CATHETERIZATION WITH CORONARY ANGIOGRAM;  Surgeon: [name], MD;  Location: [facility] CATH [facility];  Service: Cardiovascular;  Laterality: N/A;  . PERCUTANEOUS CORONARY INTERVENTION-BALLOON ONLY  02/04/2013   Procedure: PERCUTANEOUS CORONARY INTERVENTION-BALLOON ONLY;  Surgeon: [name], MD;  Location: [facility] CATH [facility];  Service: Cardiovascular;;       Home Medications    Prior to Admission medications   Medication Sig Start Date End Date Taking? Authorizing Provider  allopurinol (ZYLOPRIM) 100 MG tablet Take 100 mg by mouth daily. 05/31/16  Yes Historical Provider, MD  amLODipine (NORVASC) 5 MG tablet Take 5 mg by mouth every evening.    Yes Historical Provider, MD  aspirin EC 81 MG tablet Take 81 mg by mouth daily.   Yes Historical Provider, MD  clopidogrel (PLAVIX) 75 MG tablet Take 1 tablet (75 mg total) by mouth daily with breakfast. 03/09/13  Yes Rinaldo Cloud, MD  colchicine 0.6 MG tablet Take 0.6 mg by mouth daily as needed (for gout).  05/31/16  Yes Historical Provider, MD  famotidine (PEPCID) 20 MG tablet Take 1 tablet (20 mg total) by mouth 2 (two) times  daily as needed for heartburn. 07/06/16  Yes Orpah Cobb, MD  isosorbide mononitrate (IMDUR) 60 MG 24 hr tablet Take 60 mg by mouth 2 (two) times daily.   Yes Historical Provider, MD  lisinopril (PRINIVIL,ZESTRIL) 10 MG tablet Take 1 tablet (10 mg total) by mouth daily. 03/05/15  Yes Orpah Cobb, MD  metoprolol tartrate (LOPRESSOR) 25 MG tablet Take 0.5 tablets (12.5 mg total) by mouth 2 (two) times daily. 03/05/15  Yes Orpah Cobb, MD  nitroGLYCERIN (NITROSTAT) 0.4 MG SL tablet Place 1 tablet (0.4 mg total) under the tongue every 5  (five) minutes as needed for chest pain. 11/12/13  Yes Orpah Cobb, MD  rosuvastatin (CRESTOR) 40 MG tablet Take 40 mg by mouth daily.   Yes Historical Provider, MD  ALPRAZolam (XANAX) 0.25 MG tablet Take 1 tablet (0.25 mg total) by mouth 2 (two) times daily as needed for anxiety. Patient not taking: Reported on 08/11/2016 03/05/15   Orpah Cobb, MD  pravastatin (PRAVACHOL) 40 MG tablet Take 1 tablet (40 mg total) by mouth every evening. Patient not taking: Reported on 08/11/2016 03/05/15   Orpah Cobb, MD    Family History No family history on file.  Social History Social History  Substance Use Topics  . Smoking status: Never Smoker  . Smokeless tobacco: Never Used  . Alcohol use 7.2 oz/week    12 Cans of beer per week     Comment: 07/04/2016 "couple beers, 4-5 times/wk; 12 pack/wk"     Allergies   Patient has no known allergies.   Review of Systems Review of Systems  All other systems reviewed and are negative.    Physical Exam Updated Vital Signs BP 145/93   Pulse 79   Temp 97.8 F (36.6 C) (Oral)   Resp 15   SpO2 93%   Physical Exam  Constitutional: He is oriented to person, place, and time. He appears well-developed and well-nourished. No distress.  HENT:  Head: Normocephalic and atraumatic.  Right Ear: External ear normal.  Left Ear: External ear normal.  Eyes: Conjunctivae and EOM are normal. Pupils are equal, round, and reactive to light.  Neck: Normal range of motion and phonation normal. Neck supple.  Cardiovascular: Normal rate, regular rhythm and normal heart sounds.   Pulmonary/Chest: Effort normal and breath sounds normal. He exhibits no bony tenderness.  Abdominal: Soft. There is no tenderness.  Musculoskeletal: Normal range of motion.  Neurological: He is alert and oriented to person, place, and time. No cranial nerve deficit or sensory deficit. He exhibits normal muscle tone. Coordination normal.  Skin: Skin is warm, dry and intact.  Psychiatric: He  has a normal mood and affect. His behavior is normal. Judgment and thought content normal.  Nursing note and vitals reviewed.    ED Treatments / Results  Labs (all labs ordered are listed, but only abnormal results are displayed) Labs Reviewed  BASIC METABOLIC PANEL - Abnormal; Notable for the following:       Result Value   Sodium 133 (*)    CO2 21 (*)    Glucose, Bld 109 (*)    Creatinine, Ser 1.33 (*)    GFR calc non Af Amer 54 (*)    All other components within normal limits  CBC  I-STAT TROPOININ, ED  I-STAT TROPOININ, ED    EKG  EKG Interpretation  Date/Time:  Saturday August 11 2016 18:13:16 EST Ventricular Rate:  89 PR Interval:  172 QRS Duration: 100 QT Interval:  402 QTC Calculation: 489 R  Axis:   -62 Text Interpretation:  Sinus rhythm with occasional and consecutive Premature ventricular complexes Left axis deviation Low voltage QRS Incomplete right bundle branch block ST & T wave abnormality, consider anterior ischemia Prolonged QT Abnormal ECG Since last tracing Confirmed by Effie Shy  MD, Keiko Myricks [phone]) on 08/11/2016 6:20:30 PM       Radiology Dg Chest 2 View  Result Date: 08/11/2016 CLINICAL DATA:  Central chest pain. EXAM: CHEST  2 VIEW COMPARISON:  07/04/2016 FINDINGS: The cardiomediastinal contours are normal. Coronary stents are seen. Subsegmental atelectasis versus scarring at the lung bases. Pulmonary vasculature is normal. No consolidation, pleural effusion, or pneumothorax. No acute osseous abnormalities are seen. IMPRESSION: No acute abnormality. Electronically Signed   By: Rubye Oaks M.D.   On: 08/11/2016 19:25    Procedures Procedures (including critical care time)  Medications Ordered in ED Medications - No data to display   Initial Impression / Assessment and Plan / ED Course  I have reviewed the triage vital signs and the nursing notes.  Pertinent labs & imaging results that were available during my care of the patient were reviewed by  me and considered in my medical decision making (see chart for details).  Clinical Course as of Aug 11 2204  Sat Aug 11, 2016  2010 He has not had any recurrent chest pain, like the prior pain.  He remains comfortable with reassuring vital signs.  Initial troponin normal.  [EW]    Clinical Course User Index [EW] Mancel Bale, MD    Medications - No data to display  Patient Vitals for the past 24 hrs:  BP Temp Temp src Pulse Resp SpO2  08/11/16 2145 145/93 - - 79 15 93 %  08/11/16 2130 137/84 - - 78 16 93 %  08/11/16 2115 135/88 - - 75 22 93 %  08/11/16 2100 160/88 - - 86 22 96 %  08/11/16 2045 139/84 - - 77 15 95 %  08/11/16 2015 141/89 - - 78 16 94 %  08/11/16 2000 131/90 - - 76 16 92 %  08/11/16 1945 137/85 - - 76 18 -  08/11/16 1930 130/81 - - 78 19 94 %  08/11/16 1915 130/84 - - 76 17 93 %  08/11/16 1909 131/90 - - 77 23 92 %  08/11/16 1845 136/90 - - 74 14 92 %  08/11/16 1830 144/98 - - 83 15 93 %  08/11/16 1828 162/99 97.8 F (36.6 C) Oral 88 16 95 %  08/11/16 1810 - - - - - 96 %   Consult cardiology by phone-Dr. Sharyn Lull states that the patient can be discharged with negative delta troponin to follow-up with his primary cardiologist for consideration of any additional treatment measures.  10:07 PM Reevaluation with update and discussion. After initial assessment and treatment, an updated evaluation reveals no further complaints.  He is comfortable.  Findings discussed with patient and all questions were answered. Stevon Gough L    Final Clinical Impressions(s) / ED Diagnoses   Final diagnoses:  Nonspecific chest pain    Nonspecific chest pain with negative EGD evaluation.  Recent comprehensive evaluation with nuclear stress test and cardiac echo which were both reassuring.  Doubt ACS, PE or pneumonia.  Nonspecific chest discomfort, unlikely to represent coronary ischemia pain.  Nursing Notes Reviewed/ Care Coordinated Applicable Imaging Reviewed Interpretation of  Laboratory Data incorporated into ED treatment  The patient appears reasonably screened and/or stabilized for discharge and I doubt any other medical condition or other [facility]  requiring further screening, evaluation, or treatment in the ED at this time prior to discharge.  Plan: Home Medications-continue usual; Home Treatments-rest; return here if the recommended treatment, does not improve the symptoms; Recommended follow up-cardiology follow-up next week   New Prescriptions New Prescriptions   No medications on file     Mancel BaleElliott Toni Demo, MD 08/11/16 2208

## 2016-08-11 NOTE — Discharge Instructions (Signed)
The test today showed that there was no damage to your heart.  You recently had a cardiac stress test, which did not show ischemia under stress.  You can try taking Tylenol for the pain and using heat on the sore area 3 or 4 times a day.  Call your cardiologist for a follow-up appointment this week.  Return here, if needed, for problems.

## 2016-08-11 NOTE — ED Notes (Signed)
Patient transported to xray via stretcher.

## 2016-08-11 NOTE — ED Notes (Signed)
Patient verbalized understanding of discharge instructions and denies any further needs or questions at this time. VS stable. Patient ambulatory with steady gait. Assisted to ED entrance in wheelchair.   

## 2019-07-23 ENCOUNTER — Ambulatory Visit: Payer: Medicare Other | Attending: Internal Medicine

## 2019-07-23 DIAGNOSIS — Z23 Encounter for immunization: Secondary | ICD-10-CM | POA: Insufficient documentation

## 2019-07-23 NOTE — Progress Notes (Signed)
   Covid-19 Vaccination Clinic  Name:  [name]    MRN: [id number] DOB: 09/28/1950  07/23/2019  [name] was observed post Covid-19 immunization for 15 minutes without incidence. He was provided with Vaccine Information Sheet and instruction to access the V-Safe system.   [name] was instructed to call 911 with any severe reactions post vaccine: Marland Kitchen Difficulty breathing  . Swelling of your face and throat  . A fast heartbeat  . A bad rash all over your body  . Dizziness and weakness    Immunizations Administered    Name Date Dose VIS Date Route   Pfizer COVID-19 Vaccine 07/23/2019  3:05 PM 0.3 mL 05/22/2019 Intramuscular   Manufacturer: [employer], [employer]   Lot: [id number]   NDC: [address]-3

## 2019-08-15 ENCOUNTER — Ambulatory Visit: Payer: Medicare Other | Attending: Internal Medicine

## 2019-08-15 DIAGNOSIS — Z23 Encounter for immunization: Secondary | ICD-10-CM | POA: Insufficient documentation

## 2019-08-15 NOTE — Progress Notes (Signed)
   Covid-19 Vaccination Clinic  Name:  [name]    MRN: [id number] DOB: 1951/01/30  08/15/2019  [name] was observed post Covid-19 immunization for 15 minutes without incident. He was provided with Vaccine Information Sheet and instruction to access the V-Safe system.   [name] was instructed to call 911 with any severe reactions post vaccine: Marland Kitchen Difficulty breathing  . Swelling of face and throat  . A fast heartbeat  . A bad rash all over body  . Dizziness and weakness   Immunizations Administered    Name Date Dose VIS Date Route   Pfizer COVID-19 Vaccine 08/15/2019  1:23 PM 0.3 mL 05/22/2019 Intramuscular   Manufacturer: [employer], [employer]   Lot: [id number]   NDC: [address]-2

## 2019-09-09 DIAGNOSIS — E034 Atrophy of thyroid (acquired): Secondary | ICD-10-CM | POA: Diagnosis not present

## 2019-09-09 DIAGNOSIS — E876 Hypokalemia: Secondary | ICD-10-CM | POA: Diagnosis not present

## 2019-09-09 DIAGNOSIS — D649 Anemia, unspecified: Secondary | ICD-10-CM | POA: Diagnosis not present

## 2019-09-09 DIAGNOSIS — Z79899 Other long term (current) drug therapy: Secondary | ICD-10-CM | POA: Diagnosis not present

## 2019-09-09 DIAGNOSIS — E7849 Other hyperlipidemia: Secondary | ICD-10-CM | POA: Diagnosis not present

## 2019-09-21 DIAGNOSIS — I425 Other restrictive cardiomyopathy: Secondary | ICD-10-CM | POA: Diagnosis not present

## 2019-09-21 DIAGNOSIS — I2510 Atherosclerotic heart disease of native coronary artery without angina pectoris: Secondary | ICD-10-CM | POA: Diagnosis not present

## 2019-09-21 DIAGNOSIS — I340 Nonrheumatic mitral (valve) insufficiency: Secondary | ICD-10-CM | POA: Diagnosis not present

## 2019-09-21 DIAGNOSIS — Z9861 Coronary angioplasty status: Secondary | ICD-10-CM | POA: Diagnosis not present

## 2019-12-21 DIAGNOSIS — I10 Essential (primary) hypertension: Secondary | ICD-10-CM | POA: Diagnosis not present

## 2019-12-21 DIAGNOSIS — Z9861 Coronary angioplasty status: Secondary | ICD-10-CM | POA: Diagnosis not present

## 2019-12-21 DIAGNOSIS — K4090 Unilateral inguinal hernia, without obstruction or gangrene, not specified as recurrent: Secondary | ICD-10-CM | POA: Diagnosis not present

## 2019-12-21 DIAGNOSIS — I2510 Atherosclerotic heart disease of native coronary artery without angina pectoris: Secondary | ICD-10-CM | POA: Diagnosis not present

## 2019-12-31 ENCOUNTER — Other Ambulatory Visit: Payer: Self-pay

## 2019-12-31 ENCOUNTER — Emergency Department (HOSPITAL_COMMUNITY): Payer: Medicare Other

## 2019-12-31 ENCOUNTER — Encounter (HOSPITAL_COMMUNITY): Payer: Self-pay

## 2019-12-31 ENCOUNTER — Emergency Department (HOSPITAL_COMMUNITY)
Admission: EM | Admit: 2019-12-31 | Discharge: 2019-12-31 | Disposition: A | Discharge status: Home / Self Care | Payer: Medicare Other | Attending: Emergency Medicine | Admitting: Emergency Medicine

## 2019-12-31 DIAGNOSIS — K4091 Unilateral inguinal hernia, without obstruction or gangrene, recurrent: Secondary | ICD-10-CM | POA: Diagnosis not present

## 2019-12-31 DIAGNOSIS — K439 Ventral hernia without obstruction or gangrene: Secondary | ICD-10-CM | POA: Diagnosis not present

## 2019-12-31 DIAGNOSIS — Z79899 Other long term (current) drug therapy: Secondary | ICD-10-CM | POA: Insufficient documentation

## 2019-12-31 DIAGNOSIS — Z955 Presence of coronary angioplasty implant and graft: Secondary | ICD-10-CM | POA: Insufficient documentation

## 2019-12-31 DIAGNOSIS — Z7982 Long term (current) use of aspirin: Secondary | ICD-10-CM | POA: Diagnosis not present

## 2019-12-31 DIAGNOSIS — K4090 Unilateral inguinal hernia, without obstruction or gangrene, not specified as recurrent: Secondary | ICD-10-CM

## 2019-12-31 DIAGNOSIS — I119 Hypertensive heart disease without heart failure: Secondary | ICD-10-CM | POA: Insufficient documentation

## 2019-12-31 DIAGNOSIS — K458 Other specified abdominal hernia without obstruction or gangrene: Secondary | ICD-10-CM | POA: Diagnosis not present

## 2019-12-31 LAB — CBC WITH DIFFERENTIAL/PLATELET
Abs Immature Granulocytes: 0.08 10*3/uL — ABNORMAL HIGH (ref 0.00–0.07)
Basophils Absolute: 0.1 10*3/uL (ref 0.0–0.1)
Basophils Relative: 1 %
Eosinophils Absolute: 0.1 10*3/uL (ref 0.0–0.5)
Eosinophils Relative: 1 %
HCT: 44.7 % (ref 39.0–52.0)
Hemoglobin: 15.1 g/dL (ref 13.0–17.0)
Immature Granulocytes: 1 %
Lymphocytes Relative: 23 %
Lymphs Abs: 1.8 10*3/uL (ref 0.7–4.0)
MCH: 31.6 pg (ref 26.0–34.0)
MCHC: 33.8 g/dL (ref 30.0–36.0)
MCV: 93.5 fL (ref 80.0–100.0)
Monocytes Absolute: 1 10*3/uL (ref 0.1–1.0)
Monocytes Relative: 13 %
Neutro Abs: 4.6 10*3/uL (ref 1.7–7.7)
Neutrophils Relative %: 61 %
Platelets: 336 10*3/uL (ref 150–400)
RBC: 4.78 MIL/uL (ref 4.22–5.81)
RDW: 12.9 % (ref 11.5–15.5)
WBC: 7.6 10*3/uL (ref 4.0–10.5)
nRBC: 0 % (ref 0.0–0.2)

## 2019-12-31 LAB — BASIC METABOLIC PANEL
Anion gap: 10 (ref 5–15)
BUN: 16 mg/dL (ref 8–23)
CO2: 22 mmol/L (ref 22–32)
Calcium: 9.3 mg/dL (ref 8.9–10.3)
Chloride: 100 mmol/L (ref 98–111)
Creatinine, Ser: 1.52 mg/dL — ABNORMAL HIGH (ref 0.61–1.24)
GFR calc Af Amer: 53 mL/min — ABNORMAL LOW (ref >60)
GFR calc non Af Amer: 46 mL/min — ABNORMAL LOW (ref >60)
Glucose, Bld: 98 mg/dL (ref 70–99)
Potassium: 5.1 mmol/L (ref 3.5–5.1)
Sodium: 132 mmol/L — ABNORMAL LOW (ref 135–145)

## 2019-12-31 NOTE — ED Provider Notes (Signed)
Patient placed in Quick Look pathway, seen and evaluated   Chief Complaint: Right inguinal hernia  HPI:   Patient has a known right inguinal hernia.  He has never had it assessed.  Patient states that he was out mowing yards today when he noticed that his hernia was much larger and became quite painful.  He denies fevers or chills.  ROS: Right inguinal hernia (one)  Physical Exam:   Gen: No distress  Neuro: Awake and Alert  Skin: Warm    Focused Exam: Large right distended hernia which tracks into the right scrotum.  Anisocoria- chronic  Patient with changes to with known right inguinal hernia.  I have ordered basic labs and a CT pelvis without contrast.  Initiation of care has begun. The patient has been counseled on the process, plan, and necessity for staying for the completion/evaluation, and the remainder of the medical screening examination    Arthor Captain, PA-C 12/31/19 1517    Mancel Bale, MD 12/31/19 [phone]

## 2019-12-31 NOTE — ED Provider Notes (Signed)
[facility] EMERGENCY DEPARTMENT Provider Note   CSN: [id number] Arrival date & time: 12/31/19  1428     History Chief Complaint  Patient presents with  . Inguinal Hernia    [name] is a 69 y.o. male.  The history is provided by the patient.  Abdominal Pain Pain location: right inguinal area. Pain quality: aching   Pain severity:  Mild Onset quality:  Gradual Timing:  Intermittent Progression:  Waxing and waning Chronicity:  Recurrent Context comment:  Increasing right inguinal hernia pain, Still reducible.  Relieved by:  Nothing Worsened by:  Nothing Associated symptoms: no chest pain, no chills, no constipation, no cough, no diarrhea, no dysuria, no fever, no hematuria, no nausea, no shortness of breath, no sore throat and no vomiting   Risk factors: has not had multiple surgeries        Past Medical History:  Diagnosis Date  . Anginal pain (HCC)   . Arthritis    "ribs; right elbow" (07/04/2016)  . CAD (coronary artery disease)   . GERD (gastroesophageal reflux disease)   . Gout   . High cholesterol   . Hypertension     Patient Active Problem List   Diagnosis Date Noted  . Chest pain 07/04/2016  . Unstable angina (HCC) 07/04/2016  . Acute coronary syndrome (HCC) 03/04/2015  . Chest pain at rest 11/11/2013  . CAD (coronary artery disease)     Past Surgical History:  Procedure Laterality Date  . CORONARY ANGIOPLASTY WITH STENT PLACEMENT  2000; 2007   "1 + 2" (03/04/2015)  . LEFT HEART CATHETERIZATION WITH CORONARY ANGIOGRAM N/A 02/04/2013   Procedure: LEFT HEART CATHETERIZATION WITH CORONARY ANGIOGRAM;  Surgeon: [name], MD;  Location: [facility] CATH LAB;  Service: Cardiovascular;  Laterality: N/A;  . LEFT HEART CATHETERIZATION WITH CORONARY ANGIOGRAM N/A 02/11/2013   Procedure: LEFT HEART CATHETERIZATION WITH CORONARY ANGIOGRAM;  Surgeon: [name], MD;  Location: [facility] CATH LAB;  Service: Cardiovascular;  Laterality: N/A;  .  LEFT HEART CATHETERIZATION WITH CORONARY ANGIOGRAM N/A 11/12/2013   Procedure: LEFT HEART CATHETERIZATION WITH CORONARY ANGIOGRAM;  Surgeon: [name], MD;  Location: [facility] CATH LAB;  Service: Cardiovascular;  Laterality: N/A;  . PERCUTANEOUS CORONARY INTERVENTION-BALLOON ONLY  02/04/2013   Procedure: PERCUTANEOUS CORONARY INTERVENTION-BALLOON ONLY;  Surgeon: [name], MD;  Location: [facility] CATH LAB;  Service: Cardiovascular;;       No family history on file.  Social History   Tobacco Use  . Smoking status: Never Smoker  . Smokeless tobacco: Never Used  Substance Use Topics  . Alcohol use: Yes    Alcohol/week: 12.0 standard drinks    Types: 12 Cans of beer per week    Comment: 07/04/2016 "couple beers, 4-5 times/wk; 12 pack/wk"  . Drug use: No    Home Medications Prior to Admission medications   Medication Sig Start Date End Date Taking? Authorizing Provider  allopurinol (ZYLOPRIM) 100 MG tablet Take 100 mg by mouth daily. 05/31/16   [provider]  ALPRAZolam Prudy Feeler) 0.25 MG tablet Take 1 tablet (0.25 mg total) by mouth 2 (two) times daily as needed for anxiety. Patient not taking: Reported on 08/11/2016 03/05/15   Orpah Cobb, MD  amLODipine (NORVASC) 5 MG tablet Take 5 mg by mouth every evening.     [provider]  aspirin EC 81 MG tablet Take 81 mg by mouth daily.    [provider]  clopidogrel (PLAVIX) 75 MG tablet Take 1 tablet (75  mg total) by mouth daily with breakfast. 03/09/13   Rinaldo Cloud, MD  colchicine 0.6 MG tablet Take 0.6 mg by mouth daily as needed (for gout).  05/31/16   [provider]  famotidine (PEPCID) 20 MG tablet Take 1 tablet (20 mg total) by mouth 2 (two) times daily as needed for heartburn. 07/06/16   Orpah Cobb, MD  isosorbide mononitrate (IMDUR) 60 MG 24 hr tablet Take 60 mg by mouth 2 (two) times daily.    [provider]  lisinopril (PRINIVIL,ZESTRIL) 10 MG tablet Take 1 tablet (10 mg total) by  mouth daily. 03/05/15   Orpah Cobb, MD  metoprolol tartrate (LOPRESSOR) 25 MG tablet Take 0.5 tablets (12.5 mg total) by mouth 2 (two) times daily. 03/05/15   Orpah Cobb, MD  nitroGLYCERIN (NITROSTAT) 0.4 MG SL tablet Place 1 tablet (0.4 mg total) under the tongue every 5 (five) minutes as needed for chest pain. 11/12/13   Orpah Cobb, MD  pravastatin (PRAVACHOL) 40 MG tablet Take 1 tablet (40 mg total) by mouth every evening. Patient not taking: Reported on 08/11/2016 03/05/15   Orpah Cobb, MD  rosuvastatin (CRESTOR) 40 MG tablet Take 40 mg by mouth daily.    [provider]    Allergies    Patient has no known allergies.  Review of Systems   Review of Systems  Constitutional: Negative for chills and fever.  HENT: Negative for ear pain and sore throat.   Eyes: Negative for pain and visual disturbance.  Respiratory: Negative for cough and shortness of breath.   Cardiovascular: Negative for chest pain and palpitations.  Gastrointestinal: Positive for abdominal pain. Negative for abdominal distention, anal bleeding, blood in stool, constipation, diarrhea, nausea, rectal pain and vomiting.  Genitourinary: Negative for dysuria and hematuria.  Musculoskeletal: Negative for arthralgias and back pain.  Skin: Negative for color change and rash.  Neurological: Negative for seizures and syncope.  All other systems reviewed and are negative.   Physical Exam Updated Vital Signs BP (!) 152/91 (BP Location: Right Arm)   Pulse 78   Temp 98 F (36.7 C) (Oral)   Resp 14   Ht 5\' 7"  (1.702 m)   Wt 79.4 kg   SpO2 95%   BMI 27.41 kg/m   Physical Exam Vitals and nursing note reviewed.  Constitutional:      General: He is not in acute distress.    Appearance: He is well-developed. He is not ill-appearing.  HENT:     Head: Normocephalic and atraumatic.     Nose: Nose normal.  Eyes:     Extraocular Movements: Extraocular movements intact.     Conjunctiva/sclera: Conjunctivae  normal.     Pupils: Pupils are equal, round, and reactive to light.  Cardiovascular:     Rate and Rhythm: Normal rate and regular rhythm.     Pulses: Normal pulses.     Heart sounds: No murmur heard.   Pulmonary:     Effort: Pulmonary effort is normal. No respiratory distress.     Breath sounds: Normal breath sounds.  Abdominal:     Palpations: Abdomen is soft.     Tenderness: There is no abdominal tenderness.     Comments: Easily reducible right inguinal hernia  Genitourinary:    Penis: Normal.      Testes: Normal.  Musculoskeletal:     Cervical back: Neck supple.  Skin:    General: Skin is warm and dry.  Neurological:     Mental Status: He is alert.  ED Results / Procedures / Treatments   Labs (all labs ordered are listed, but only abnormal results are displayed) Labs Reviewed  CBC WITH DIFFERENTIAL/PLATELET - Abnormal; Notable for the following components:      Result Value   Abs Immature Granulocytes 0.08 (*)    All other components within normal limits  BASIC METABOLIC PANEL - Abnormal; Notable for the following components:   Sodium 132 (*)    Creatinine, Ser 1.52 (*)    GFR calc non Af Amer 46 (*)    GFR calc Af Amer 53 (*)    All other components within normal limits    EKG None  Radiology CT PELVIS WO CONTRAST  Result Date: 12/31/2019 CLINICAL DATA:  Inguinal hernia suspected EXAM: CT PELVIS WITHOUT CONTRAST TECHNIQUE: Multidetector CT imaging of the pelvis was performed following the standard protocol without intravenous contrast. COMPARISON:  None. FINDINGS: Urinary Tract:  No abnormality visualized. Bowel:  Unremarkable visualized pelvic bowel loops. Vascular/Lymphatic: No pathologically enlarged lymph nodes. No significant vascular abnormality seen. Reproductive:  No mass or other significant abnormality Other: There is a large right inguinal hernia containing loops of nonobstructed distal small bowel. The hernia sac measures approximately 10.4 x 5.3 x  4.8 cm (series 3, image 41, series 5, image 33) Musculoskeletal: No suspicious bone lesions identified. IMPRESSION: There is a large right inguinal hernia containing loops of nonobstructed distal small bowel. The hernia sac measures approximately 10.4 x 5.3 x 4.8 cm. Electronically Signed   By: Lauralyn Primes M.D.   On: 12/31/2019 16:31    Procedures Procedures (including critical care time)  Medications Ordered in ED Medications - No data to display  ED Course  I have reviewed the triage vital signs and the nursing notes.  Pertinent labs & imaging results that were available during my care of the patient were reviewed by me and considered in my medical decision making (see chart for details).    MDM Rules/Calculators/A&P                          [name] is a 69 year old male who presents to the ED with right inguinal pain.  Patient with large right inguinal hernia that is easily reducible.  This is seen on CT scan that was done prior to my evaluation.  No signs of incarceration or strangulation on CT or on exam.  No fever.  Normal white count.  Easily reducible.  Recommend compression pants, no heavy lifting, MiraLAX.  We will have him follow-up with general surgery for repair.  Understands return precautions.  Discharged in good condition.  This chart was dictated using voice recognition software.  Despite best efforts to proofread,  errors can occur which can change the documentation meaning.   Final Clinical Impression(s) / ED Diagnoses Final diagnoses:  Right inguinal hernia    Rx / DC Orders ED Discharge Orders    None       Virgina Norfolk, DO 12/31/19 1919

## 2019-12-31 NOTE — ED Triage Notes (Signed)
Pt reports right sided inguinal hernia x 2 years that started to become painful about 1 hour prior to arrival.

## 2020-01-27 DIAGNOSIS — K4090 Unilateral inguinal hernia, without obstruction or gangrene, not specified as recurrent: Secondary | ICD-10-CM | POA: Diagnosis not present

## 2020-02-01 ENCOUNTER — Other Ambulatory Visit (HOSPITAL_COMMUNITY)
Admission: RE | Admit: 2020-02-01 | Discharge: 2020-02-01 | Disposition: A | Discharge status: Home / Self Care | Payer: Medicare Other | Source: Ambulatory Visit | Attending: General Surgery | Admitting: General Surgery

## 2020-02-01 ENCOUNTER — Ambulatory Visit: Payer: Self-pay | Admitting: General Surgery

## 2020-02-01 DIAGNOSIS — Z01812 Encounter for preprocedural laboratory examination: Secondary | ICD-10-CM | POA: Diagnosis not present

## 2020-02-01 DIAGNOSIS — Z20822 Contact with and (suspected) exposure to covid-19: Secondary | ICD-10-CM | POA: Insufficient documentation

## 2020-02-01 LAB — SARS CORONAVIRUS 2 (TAT 6-24 HRS): SARS Coronavirus 2: NEGATIVE

## 2020-02-02 ENCOUNTER — Other Ambulatory Visit: Payer: Self-pay

## 2020-02-02 ENCOUNTER — Encounter (HOSPITAL_BASED_OUTPATIENT_CLINIC_OR_DEPARTMENT_OTHER): Payer: Self-pay | Admitting: General Surgery

## 2020-02-02 NOTE — Progress Notes (Addendum)
ADDENDUM:  Requested pt's cardiac lov note and ekg, have not received yet via fax.  If it does come pt will need EKG in pre-op.   ADDENDUM:  Received pt cardiac clearance via fax from Dr Sheliah Hatch office, dated 02-02-2020 stated that the office gave pt instructions to stop plavix 2 days prior,placed with chart.   Spoke w/ via phone for pre-op interview--- PT Lab needs dos---- Istat               Lab results------ request ekg from dr Lonia Mad, pt stated had one done 06/ 2021 COVID test ------ 02-01-2020 @ 1105 Arrive at ------- 0700 NPO after MN NO Solid Food.  Clear liquids from MN until--- 0600 Medications to take morning of surgery ----- Imdur, Lopressor, Pepcid w/ sips of water Diabetic medication ----- n/a Patient Special Instructions ----- n/a Pre-Op special Istructions ----- called and requested cardiac clearence from Dr Sheliah Hatch office for asa/ plavix ,  Spoke w/ Tresa Endo , triage nurse , at Dr Magnus Ivan office Patient verbalized understanding of instructions that were given at this phone interview. Patient denies shortness of breath, chest pain, fever, cough at this phone interview.   Anesthesia Review:  Hx CAD s/p PCI and stenting ;  HTN; Chronic anigna; gout.  Pt denies any cardiac s&s and no peripheral swelling.  Stated last taken nitro 5 yrs ago, currently does not have prescription, he did not get refilled.    PCP: Dr Ramond Craver Cardiologist : Dr Ramond Craver (per pt lov 06/ 2021, requested note, ekg, last stress test, and echo) Chest x-ray : 08-11-2016 epic EKG :  Last one epic 08-11-2016;  Per pt had one done 06/ 2021 (requested tracing) Echo : 07-06-2016 epic Stress test: 07-05-2016 epic Cardiac Cath :  11-12-2013 epic Activity level:  Denies any sob  Sleep Study/ CPAP : NO Fasting Blood Sugar :      / Checks Blood Sugar -- times a day:  N/A Blood Thinner/ Instructions /Last Dose: Plavix ASA / Instructions/ Last Dose : ASA 81mg  Per pt was not

## 2020-02-04 ENCOUNTER — Encounter (HOSPITAL_BASED_OUTPATIENT_CLINIC_OR_DEPARTMENT_OTHER): Payer: Self-pay | Admitting: General Surgery

## 2020-02-04 ENCOUNTER — Encounter (HOSPITAL_BASED_OUTPATIENT_CLINIC_OR_DEPARTMENT_OTHER)
Admission: RE | Disposition: A | Discharge status: Home / Self Care | Payer: Self-pay | Source: Home / Self Care | Attending: General Surgery

## 2020-02-04 ENCOUNTER — Ambulatory Visit (HOSPITAL_BASED_OUTPATIENT_CLINIC_OR_DEPARTMENT_OTHER): Payer: Medicare Other | Admitting: Anesthesiology

## 2020-02-04 ENCOUNTER — Ambulatory Visit (HOSPITAL_BASED_OUTPATIENT_CLINIC_OR_DEPARTMENT_OTHER)
Admission: RE | Admit: 2020-02-04 | Discharge: 2020-02-04 | Disposition: A | Discharge status: Home / Self Care | Payer: Medicare Other | Attending: General Surgery | Admitting: General Surgery

## 2020-02-04 ENCOUNTER — Other Ambulatory Visit: Payer: Self-pay

## 2020-02-04 DIAGNOSIS — M19021 Primary osteoarthritis, right elbow: Secondary | ICD-10-CM | POA: Insufficient documentation

## 2020-02-04 DIAGNOSIS — I25119 Atherosclerotic heart disease of native coronary artery with unspecified angina pectoris: Secondary | ICD-10-CM | POA: Insufficient documentation

## 2020-02-04 DIAGNOSIS — Z79899 Other long term (current) drug therapy: Secondary | ICD-10-CM | POA: Insufficient documentation

## 2020-02-04 DIAGNOSIS — I10 Essential (primary) hypertension: Secondary | ICD-10-CM | POA: Insufficient documentation

## 2020-02-04 DIAGNOSIS — Z7982 Long term (current) use of aspirin: Secondary | ICD-10-CM | POA: Insufficient documentation

## 2020-02-04 DIAGNOSIS — K219 Gastro-esophageal reflux disease without esophagitis: Secondary | ICD-10-CM | POA: Diagnosis not present

## 2020-02-04 DIAGNOSIS — Z7902 Long term (current) use of antithrombotics/antiplatelets: Secondary | ICD-10-CM | POA: Diagnosis not present

## 2020-02-04 DIAGNOSIS — Z955 Presence of coronary angioplasty implant and graft: Secondary | ICD-10-CM | POA: Insufficient documentation

## 2020-02-04 DIAGNOSIS — I4510 Unspecified right bundle-branch block: Secondary | ICD-10-CM | POA: Insufficient documentation

## 2020-02-04 DIAGNOSIS — I25110 Atherosclerotic heart disease of native coronary artery with unstable angina pectoris: Secondary | ICD-10-CM | POA: Diagnosis not present

## 2020-02-04 DIAGNOSIS — G8918 Other acute postprocedural pain: Secondary | ICD-10-CM | POA: Diagnosis not present

## 2020-02-04 DIAGNOSIS — K4090 Unilateral inguinal hernia, without obstruction or gangrene, not specified as recurrent: Secondary | ICD-10-CM | POA: Diagnosis not present

## 2020-02-04 DIAGNOSIS — E785 Hyperlipidemia, unspecified: Secondary | ICD-10-CM | POA: Diagnosis not present

## 2020-02-04 HISTORY — DX: Hyperlipidemia, unspecified: E78.5

## 2020-02-04 HISTORY — DX: Presence of coronary angioplasty implant and graft: Z95.5

## 2020-02-04 HISTORY — DX: Unspecified right bundle-branch block: I45.10

## 2020-02-04 HISTORY — PX: INGUINAL HERNIA REPAIR: SHX194

## 2020-02-04 HISTORY — DX: Unilateral inguinal hernia, without obstruction or gangrene, not specified as recurrent: K40.90

## 2020-02-04 HISTORY — DX: Chronic gout, unspecified, without tophus (tophi): M1A.9XX0

## 2020-02-04 HISTORY — DX: Other forms of angina pectoris: I20.89

## 2020-02-04 LAB — POCT I-STAT, CHEM 8
BUN: 15 mg/dL (ref 8–23)
Calcium, Ion: 1.31 mmol/L (ref 1.15–1.40)
Chloride: 99 mmol/L (ref 98–111)
Creatinine, Ser: 1.5 mg/dL — ABNORMAL HIGH (ref 0.61–1.24)
Glucose, Bld: 100 mg/dL — ABNORMAL HIGH (ref 70–99)
HCT: 46 % (ref 39.0–52.0)
Hemoglobin: 15.6 g/dL (ref 13.0–17.0)
Potassium: 4.3 mmol/L (ref 3.5–5.1)
Sodium: 135 mmol/L (ref 135–145)
TCO2: 25 mmol/L (ref 22–32)

## 2020-02-04 SURGERY — REPAIR, HERNIA, INGUINAL, ADULT
Anesthesia: General | Site: Inguinal | Laterality: Right

## 2020-02-04 MED ORDER — EPHEDRINE 5 MG/ML INJ
INTRAVENOUS | Status: AC
  Filled 2020-02-04: qty 10

## 2020-02-04 MED ORDER — LIDOCAINE 2% (20 MG/ML) 5 ML SYRINGE
INTRAMUSCULAR | Status: DC | PRN
  Administered 2020-02-04: 100 mg via INTRAVENOUS

## 2020-02-04 MED ORDER — ONDANSETRON HCL 4 MG/2ML IJ SOLN
INTRAMUSCULAR | Status: AC
  Filled 2020-02-04: qty 2

## 2020-02-04 MED ORDER — LIDOCAINE 2% (20 MG/ML) 5 ML SYRINGE
INTRAMUSCULAR | Status: AC
  Filled 2020-02-04: qty 5

## 2020-02-04 MED ORDER — DEXAMETHASONE SODIUM PHOSPHATE 10 MG/ML IJ SOLN
INTRAMUSCULAR | Status: AC
  Filled 2020-02-04: qty 1

## 2020-02-04 MED ORDER — ENSURE PRE-SURGERY PO LIQD: 296.0000 mL | Freq: Once | ORAL | Status: DC

## 2020-02-04 MED ORDER — PHENYLEPHRINE 40 MCG/ML (10ML) SYRINGE FOR IV PUSH (FOR BLOOD PRESSURE SUPPORT)
PREFILLED_SYRINGE | INTRAVENOUS | Status: AC
  Filled 2020-02-04: qty 10

## 2020-02-04 MED ORDER — PROPOFOL 10 MG/ML IV BOLUS
INTRAVENOUS | Status: DC | PRN
  Administered 2020-02-04: 130 mg via INTRAVENOUS

## 2020-02-04 MED ORDER — PHENYLEPHRINE 40 MCG/ML (10ML) SYRINGE FOR IV PUSH (FOR BLOOD PRESSURE SUPPORT)
PREFILLED_SYRINGE | INTRAVENOUS | Status: DC | PRN
  Administered 2020-02-04: 40 ug via INTRAVENOUS
  Administered 2020-02-04: 80 ug via INTRAVENOUS

## 2020-02-04 MED ORDER — PROPOFOL 10 MG/ML IV BOLUS
INTRAVENOUS | Status: AC
  Filled 2020-02-04: qty 20

## 2020-02-04 MED ORDER — FENTANYL CITRATE (PF) 100 MCG/2ML IJ SOLN
100.0000 ug | Freq: Once | INTRAMUSCULAR | Status: AC
  Administered 2020-02-04: 50 ug via INTRAVENOUS

## 2020-02-04 MED ORDER — PROPOFOL 500 MG/50ML IV EMUL
INTRAVENOUS | Status: AC
  Filled 2020-02-04: qty 50

## 2020-02-04 MED ORDER — ACETAMINOPHEN 500 MG PO TABS
1000.0000 mg | ORAL_TABLET | ORAL | Status: AC
  Administered 2020-02-04: 1000 mg via ORAL

## 2020-02-04 MED ORDER — IBUPROFEN 800 MG PO TABS: 800.0000 mg | ORAL_TABLET | Freq: Three times a day (TID) | ORAL | 0 refills | Status: DC | PRN

## 2020-02-04 MED ORDER — CEFAZOLIN SODIUM-DEXTROSE 2-4 GM/100ML-% IV SOLN
INTRAVENOUS | Status: AC
  Filled 2020-02-04: qty 100

## 2020-02-04 MED ORDER — CHLORHEXIDINE GLUCONATE CLOTH 2 % EX PADS: 6.0000 | MEDICATED_PAD | Freq: Once | CUTANEOUS | Status: DC

## 2020-02-04 MED ORDER — DEXAMETHASONE SODIUM PHOSPHATE 10 MG/ML IJ SOLN
INTRAMUSCULAR | Status: DC | PRN
  Administered 2020-02-04 (×2): 5 mg via INTRAVENOUS

## 2020-02-04 MED ORDER — MIDAZOLAM HCL 2 MG/2ML IJ SOLN
2.0000 mg | Freq: Once | INTRAMUSCULAR | Status: AC
  Administered 2020-02-04: 2 mg via INTRAVENOUS

## 2020-02-04 MED ORDER — MEPERIDINE HCL 25 MG/ML IJ SOLN: 6.2500 mg | INTRAMUSCULAR | Status: DC | PRN

## 2020-02-04 MED ORDER — OXYCODONE HCL 5 MG PO TABS: 5.0000 mg | ORAL_TABLET | Freq: Four times a day (QID) | ORAL | 0 refills | Status: DC | PRN

## 2020-02-04 MED ORDER — LACTATED RINGERS IV SOLN: INTRAVENOUS | Status: DC

## 2020-02-04 MED ORDER — ACETAMINOPHEN 500 MG PO TABS
ORAL_TABLET | ORAL | Status: AC
  Filled 2020-02-04: qty 2

## 2020-02-04 MED ORDER — FENTANYL CITRATE (PF) 100 MCG/2ML IJ SOLN
INTRAMUSCULAR | Status: DC | PRN
  Administered 2020-02-04 (×2): 25 ug via INTRAVENOUS
  Administered 2020-02-04: 50 ug via INTRAVENOUS

## 2020-02-04 MED ORDER — MIDAZOLAM HCL 2 MG/2ML IJ SOLN
INTRAMUSCULAR | Status: AC
  Filled 2020-02-04: qty 2

## 2020-02-04 MED ORDER — ONDANSETRON HCL 4 MG/2ML IJ SOLN: 4.0000 mg | Freq: Once | INTRAMUSCULAR | Status: DC | PRN

## 2020-02-04 MED ORDER — FENTANYL CITRATE (PF) 100 MCG/2ML IJ SOLN
INTRAMUSCULAR | Status: AC
  Filled 2020-02-04: qty 2

## 2020-02-04 MED ORDER — KETOROLAC TROMETHAMINE 15 MG/ML IJ SOLN: 15.0000 mg | INTRAMUSCULAR | Status: DC

## 2020-02-04 MED ORDER — WHITE PETROLATUM EX OINT
TOPICAL_OINTMENT | CUTANEOUS | Status: AC
  Filled 2020-02-04: qty 5

## 2020-02-04 MED ORDER — HYDROMORPHONE HCL 1 MG/ML IJ SOLN: 0.2500 mg | INTRAMUSCULAR | Status: DC | PRN

## 2020-02-04 MED ORDER — CEFAZOLIN SODIUM-DEXTROSE 2-4 GM/100ML-% IV SOLN
2.0000 g | INTRAVENOUS | Status: AC
  Administered 2020-02-04: 2 g via INTRAVENOUS

## 2020-02-04 MED ORDER — ONDANSETRON HCL 4 MG/2ML IJ SOLN
INTRAMUSCULAR | Status: DC | PRN
  Administered 2020-02-04: 4 mg via INTRAVENOUS

## 2020-02-04 MED ORDER — EPHEDRINE SULFATE-NACL 50-0.9 MG/10ML-% IV SOSY
PREFILLED_SYRINGE | INTRAVENOUS | Status: DC | PRN
  Administered 2020-02-04 (×4): 10 mg via INTRAVENOUS

## 2020-02-04 SURGICAL SUPPLY — 55 items
ADH SKN CLS APL DERMABOND .7 (GAUZE/BANDAGES/DRESSINGS) ×1
APL PRP STRL LF DISP 70% ISPRP (MISCELLANEOUS) ×1
BLADE CLIPPER SENSICLIP SURGIC (BLADE) ×3 IMPLANT
BLADE HEX COATED 2.75 (ELECTRODE) ×3 IMPLANT
BLADE SURG 15 STRL LF DISP TIS (BLADE) ×1 IMPLANT
BLADE SURG 15 STRL SS (BLADE) ×3
CANISTER SUCT 3000ML PPV (MISCELLANEOUS) IMPLANT
CELLS DAT CNTRL 66122 CELL SVR (MISCELLANEOUS) IMPLANT
CHLORAPREP W/TINT 26 (MISCELLANEOUS) ×3 IMPLANT
COVER BACK TABLE 60X90IN (DRAPES) ×3 IMPLANT
COVER MAYO STAND STRL (DRAPES) ×3 IMPLANT
COVER WAND RF STERILE (DRAPES) ×3 IMPLANT
DERMABOND ADVANCED (GAUZE/BANDAGES/DRESSINGS) ×2
DERMABOND ADVANCED .7 DNX12 (GAUZE/BANDAGES/DRESSINGS) ×1 IMPLANT
DRAIN PENROSE 0.25X18 (DRAIN) IMPLANT
DRAPE LAPAROSCOPIC ABDOMINAL (DRAPES) ×3 IMPLANT
DRAPE UTILITY XL STRL (DRAPES) ×3 IMPLANT
ELECT REM PT RETURN 9FT ADLT (ELECTROSURGICAL) ×3
ELECTRODE REM PT RTRN 9FT ADLT (ELECTROSURGICAL) ×1 IMPLANT
GLOVE BIOGEL PI IND STRL 7.0 (GLOVE) ×1 IMPLANT
GLOVE BIOGEL PI IND STRL 7.5 (GLOVE) ×2 IMPLANT
GLOVE BIOGEL PI INDICATOR 7.0 (GLOVE) ×2
GLOVE BIOGEL PI INDICATOR 7.5 (GLOVE) ×4
GLOVE ECLIPSE 7.5 STRL STRAW (GLOVE) ×3 IMPLANT
GLOVE SURG SS PI 7.0 STRL IVOR (GLOVE) ×3 IMPLANT
GOWN STRL REUS W/ TWL LRG LVL3 (GOWN DISPOSABLE) ×2 IMPLANT
GOWN STRL REUS W/TWL LRG LVL3 (GOWN DISPOSABLE) ×6
KIT TURNOVER CYSTO (KITS) ×3 IMPLANT
MESH HERNIA 3X6 (Mesh General) ×3 IMPLANT
NEEDLE HYPO 25X1 1.5 SAFETY (NEEDLE) ×3 IMPLANT
NS IRRIG 500ML POUR BTL (IV SOLUTION) ×3 IMPLANT
PACK BASIN DAY SURGERY FS (CUSTOM PROCEDURE TRAY) ×3 IMPLANT
PAD ARMBOARD 7.5X6 YLW CONV (MISCELLANEOUS) ×3 IMPLANT
PENCIL SMOKE EVACUATOR (MISCELLANEOUS) ×3 IMPLANT
RTRCTR WOUND ALEXIS 18CM MED (MISCELLANEOUS)
SPONGE LAP 4X18 RFD (DISPOSABLE) IMPLANT
SUT MNCRL AB 4-0 PS2 18 (SUTURE) ×3 IMPLANT
SUT PDS AB 0 CT1 36 (SUTURE) IMPLANT
SUT PDS AB 2-0 CT2 27 (SUTURE) ×3 IMPLANT
SUT PROLENE 2 0 CT2 30 (SUTURE) ×6 IMPLANT
SUT SILK 3 0 TIES 17X18 (SUTURE)
SUT SILK 3-0 18XBRD TIE BLK (SUTURE) IMPLANT
SUT VIC AB 2-0 SH 18 (SUTURE) ×3 IMPLANT
SUT VIC AB 2-0 SH 27 (SUTURE) ×3
SUT VIC AB 2-0 SH 27XBRD (SUTURE) ×1 IMPLANT
SUT VIC AB 3-0 SH 27 (SUTURE) ×3
SUT VIC AB 3-0 SH 27X BRD (SUTURE) ×1 IMPLANT
SUT VICRYL 2 0 18  UND BR (SUTURE) ×3
SUT VICRYL 2 0 18 UND BR (SUTURE) ×1 IMPLANT
SYR BULB IRRIG 60ML STRL (SYRINGE) ×3 IMPLANT
SYR CONTROL 10ML LL (SYRINGE) ×3 IMPLANT
TOWEL OR 17X26 10 PK STRL BLUE (TOWEL DISPOSABLE) ×3 IMPLANT
TUBE CONNECTING 12'X1/4 (SUCTIONS) ×1
TUBE CONNECTING 12X1/4 (SUCTIONS) ×2 IMPLANT
YANKAUER SUCT BULB TIP NO VENT (SUCTIONS) ×3 IMPLANT

## 2020-02-04 NOTE — H&P (Signed)
[name] is an 69 y.o. male.   Chief Complaint: hernia HPI: 69 yo male with right groin pain especially with movement and lifting. He denies nausea or vomiting.  Past Medical History:  Diagnosis Date  . Arthritis    right elbow  . CAD (coronary artery disease) cardiologist--- dr Lonia Mad   several cardiac cath's with PTCA and stenting from 2000, 2007, 2014 (total 3 DES)  . Chronic gout    02-02-2020 per pt last episode 02/ 2021 right knee  . Chronic stable angina (HCC)   . GERD (gastroesophageal reflux disease)   . Hyperlipidemia   . Hypertension   . RBBB (right bundle branch block)   . Right inguinal hernia   . S/P drug eluting coronary stent placement     Past Surgical History:  Procedure Laterality Date  . CARDIAC CATHETERIZATION  05/03/2006   patent D1 stent with moderate prestent ostial stenosis,  patent LAD stent  . CORONARY ANGIOPLASTY  11/21/007   PTCA balloon to ostial D1  . CORONARY ANGIOPLASTY WITH STENT PLACEMENT  2000   dr Melchor Amour   PTCA and stent to mLAD  . CORONARY ANGIOPLASTY WITH STENT PLACEMENT  01-18-2006  dr Melchor Amour    PTCA and to ostial diagonal/ mLAD and DES x2  . LEFT HEART CATHETERIZATION WITH CORONARY ANGIOGRAM N/A 02/04/2013   Procedure: LEFT HEART CATHETERIZATION WITH CORONARY ANGIOGRAM;  Surgeon: [name], MD;  Location: [facility] CATH LAB;  Service: Cardiovascular;  Laterality: N/A;  . LEFT HEART CATHETERIZATION WITH CORONARY ANGIOGRAM N/A 02/11/2013   Procedure: LEFT HEART CATHETERIZATION WITH CORONARY ANGIOGRAM;  Surgeon: [name], MD;  Location: [facility] CATH LAB;  Service: Cardiovascular;  Laterality: N/A;  . LEFT HEART CATHETERIZATION WITH CORONARY ANGIOGRAM N/A 11/12/2013   Procedure: LEFT HEART CATHETERIZATION WITH CORONARY ANGIOGRAM;  Surgeon: [name], MD;  Location: [facility] CATH LAB;  Service: Cardiovascular;  Laterality: N/A;  . PERCUTANEOUS CORONARY INTERVENTION-BALLOON ONLY  02/04/2013   Procedure: PERCUTANEOUS CORONARY  INTERVENTION-BALLOON ONLY;  Surgeon: [name], MD;  Location: [facility] CATH LAB;  Service: Cardiovascular;;    History reviewed. No pertinent family history. Social History:  reports that he has never smoked. He has never used smokeless tobacco. He reports current alcohol use. He reports that he does not use drugs.  Allergies: No Known Allergies  Medications Prior to Admission  Medication Sig Dispense Refill  . allopurinol (ZYLOPRIM) 100 MG tablet Take 100 mg by mouth at bedtime.   1  . amLODipine (NORVASC) 5 MG tablet Take 5 mg by mouth every evening.     Marland Kitchen aspirin EC 81 MG tablet Take 81 mg by mouth daily.    . clopidogrel (PLAVIX) 75 MG tablet Take 1 tablet (75 mg total) by mouth daily with breakfast. (Patient taking differently: Take 75 mg by mouth at bedtime. ) 30 tablet 3  . famotidine (PEPCID) 20 MG tablet Take 1 tablet (20 mg total) by mouth 2 (two) times daily as needed for heartburn.    . isosorbide mononitrate (IMDUR) 60 MG 24 hr tablet Take 60 mg by mouth 2 (two) times daily.    Marland Kitchen lisinopril (PRINIVIL,ZESTRIL) 10 MG tablet Take 1 tablet (10 mg total) by mouth daily. (Patient taking differently: Take 10 mg by mouth daily. ) 90 tablet 3  . metoprolol tartrate (LOPRESSOR) 25 MG tablet Take 0.5 tablets (12.5 mg total) by mouth 2 (two) times daily. (Patient taking differently: Take 25 mg by mouth daily. ) 90 tablet 3  .  rosuvastatin (CRESTOR) 40 MG tablet Take 40 mg by mouth at bedtime.     . colchicine 0.6 MG tablet Take 0.6 mg by mouth daily as needed (for gout).   2  . nitroGLYCERIN (NITROSTAT) 0.4 MG SL tablet Place 1 tablet (0.4 mg total) under the tongue every 5 (five) minutes as needed for chest pain. (Patient not taking: Reported on 02/02/2020) 50 tablet 3    Results for orders placed or performed during the hospital encounter of 02/04/20 (from the past 48 hour(s))  I-STAT, chem 8     Status: Abnormal   Collection Time: 02/04/20  7:30 AM  Result Value Ref Range   Sodium 135  135 - 145 mmol/L   Potassium 4.3 3.5 - 5.1 mmol/L   Chloride 99 98 - 111 mmol/L   BUN 15 8 - 23 mg/dL   Creatinine, Ser 0.09 (H) 0.61 - 1.24 mg/dL   Glucose, Bld 233 (H) 70 - 99 mg/dL    Comment: Glucose reference range applies only to samples taken after fasting for at least 8 hours.   Calcium, Ion 1.31 1.15 - 1.40 mmol/L   TCO2 25 22 - 32 mmol/L   Hemoglobin 15.6 13.0 - 17.0 g/dL   HCT 00.7 39 - 52 %   No results found.  Review of Systems  Constitutional: Negative for chills and fever.  HENT: Negative for hearing loss.   Respiratory: Negative for cough.   Cardiovascular: Negative for chest pain and palpitations.  Gastrointestinal: Negative for abdominal pain, nausea and vomiting.  Genitourinary: Negative for dysuria and urgency.  Musculoskeletal: Negative for myalgias and neck pain.  Skin: Negative for rash.  Neurological: Negative for dizziness and headaches.  Hematological: Does not bruise/bleed easily.  Psychiatric/Behavioral: Negative for suicidal ideas.    Blood pressure 132/87, pulse 77, temperature 98 F (36.7 C), temperature source Oral, resp. rate 17, height 5\' 7"  (1.702 m), weight 78.1 kg, SpO2 99 %. Physical Exam Vitals reviewed.  Constitutional:      Appearance: He is well-developed.  HENT:     Head: Normocephalic and atraumatic.  Eyes:     Conjunctiva/sclera: Conjunctivae normal.     Pupils: Pupils are equal, round, and reactive to light.  Cardiovascular:     Rate and Rhythm: Normal rate and regular rhythm.  Pulmonary:     Effort: Pulmonary effort is normal.     Breath sounds: Normal breath sounds.  Abdominal:     General: Bowel sounds are normal. There is no distension.     Palpations: Abdomen is soft.     Tenderness: There is no abdominal tenderness.     Comments: Right inguinal hernia  Musculoskeletal:        General: Normal range of motion.     Cervical back: Normal range of motion and neck supple.  Skin:    General: Skin is warm and dry.   Neurological:     Mental Status: He is alert and oriented to person, place, and time.  Psychiatric:        Behavior: Behavior normal.      Assessment/Plan 69 yo male with right inguinal hernia -open inguinal hernia repair with mesh -ERAS protocol -outpatient procedure  64, MD 02/04/2020, 8:28 AM

## 2020-02-04 NOTE — Anesthesia Procedure Notes (Signed)
Anesthesia Regional Block: TAP block   Pre-Anesthetic Checklist: ,, timeout performed, Correct Patient, Correct Site, Correct Laterality, Correct Procedure, Correct Position, site marked, Risks and benefits discussed,  Surgical consent,  Pre-op evaluation,  At surgeon's request and post-op pain management  Laterality: Right  Prep: chloraprep       Needles:  Injection technique: Single-shot  Needle Type: Echogenic Stimulator Needle     Needle Length: 9cm  Needle Gauge: 21     Additional Needles:   Narrative:  Start time: 02/04/2020 7:48 AM End time: 02/04/2020 7:58 AM Injection made incrementally with aspirations every 5 mL.  Performed by: Personally  Anesthesiologist: Arta Bruce, MD  Additional Notes: Monitors applied. Patient sedated. Sterile prep and drape,hand hygiene and sterile gloves were used. Relevant anatomy identified.Needle position confirmed.Local anesthetic injected incrementally after negative aspiration. Local anesthetic spread visualized in Transversus Abdominus Plane. Vascular puncture avoided. No complications. Image printed for medical record.The patient tolerated the procedure well.

## 2020-02-04 NOTE — Transfer of Care (Signed)
Immediate Anesthesia Transfer of Care Note  Patient: [name]  Procedure(s) Performed: Procedure(s) (LRB): RIGHT OPEN INGUINAL HERNIA REPAIR WITH MESH (Right)  Patient Location: PACU  Anesthesia Type: General  Level of Consciousness: awake, oriented, sedated and patient cooperative  Airway & Oxygen Therapy: Patient Spontanous Breathing and Patient connected to face mask oxygen  Post-op Assessment: Report given to PACU RN and Post -op Vital signs reviewed and stable  Post vital signs: Reviewed and stable  Complications: No apparent anesthesia complications  Last Vitals:  Vitals Value Taken Time  BP 122/78 02/04/20 1007  Temp    Pulse 85 02/04/20 1008  Resp 25 02/04/20 1008  SpO2 96 % 02/04/20 1008  Vitals shown include unvalidated device data.  Last Pain:  Vitals:   02/04/20 0705  TempSrc: Oral      Patients Stated Pain Goal: 4 (02/04/20 1194)  Complications: No complications documented.

## 2020-02-04 NOTE — Anesthesia Preprocedure Evaluation (Signed)
Anesthesia Evaluation  Patient identified by MRN, date of birth, ID band Patient awake    Reviewed: Allergy & Precautions, NPO status , Patient's Chart, lab work & pertinent test results  Airway Mallampati: II  TM Distance: >3 FB Neck ROM: Full    Dental   Pulmonary    Pulmonary exam normal        Cardiovascular hypertension, + angina + CAD and + Cardiac Stents  Normal cardiovascular exam     Neuro/Psych    GI/Hepatic GERD  Medicated and Controlled,  Endo/Other    Renal/GU      Musculoskeletal   Abdominal   Peds  Hematology   Anesthesia Other Findings   Reproductive/Obstetrics                             Anesthesia Physical Anesthesia Plan  ASA: III  Anesthesia Plan: General   Post-op Pain Management:  Regional for Post-op pain   Induction: Intravenous  PONV Risk Score and Plan: 2  Airway Management Planned: LMA  Additional Equipment:   Intra-op Plan:   Post-operative Plan: Extubation in OR  Informed Consent: I have reviewed the patients History and Physical, chart, labs and discussed the procedure including the risks, benefits and alternatives for the proposed anesthesia with the patient or authorized representative who has indicated his/her understanding and acceptance.       Plan Discussed with: CRNA and Surgeon  Anesthesia Plan Comments:         Anesthesia Quick Evaluation

## 2020-02-04 NOTE — Op Note (Signed)
Preop diagnosis: right inguinal hernia  Postop diagnosis: right inguinal hernia  Procedure: open Right inguinal hernia repair with mesh  Surgeon: Feliciana Rossetti, M.D.  Asst: none  Anesthesia: Gen.   Indications for procedure: [name] is a 69 y.o. male with symptoms of pain and enlarging Right inguinal hernia(s). After discussing risks, alternatives and benefits he decided on open repair and was brought to day surgery for repair.  Description of procedure: The patient was brought into the operative suite, placed supine. Anesthesia was administered with endotracheal tube. Patient was strapped in place. The patient was prepped and draped in the usual sterile fashion.  The anterior superior iliac spine and pubic tubercle were identified on the Right side. An incision was made 1cm above the connecting line, representative of the location of the inguinal ligament. The subcutaneous tissue was bluntly dissected, scarpa's fascia was dissected away. The external abdominal oblique fascia was identified and sharply opened down to the external inguinal ring. The conjoint tendon and inguinal ligament were identified. The cord structures and sac were dissected free of the surrounding tissue in 360 degrees. A penrose drain was used to encircle the contents. The cremasteric fibers were dissected free of the contents of the cord and hernia sac. The cord structures (vessels and vas deferens) were identified and carefully dissected away from the hernia sac. The hernia was large and fatty and indirect The hernia sac was dissected down to the internal inguinal ring. Preperitoneal fat was identified showing appropriate dissection. The sac was divided and closed with 3-0 vicryl in running fashion. The sac was then reduced into the preperitoneal space. A 2-0 PDS was used to appose the inguinal ligament to the conjoint tendon. A 3x6 Bard mesh was then used to close the defect and reinforce the floor. The mesh was  sutured to the lacunar ligament and inguinal ligament using a 2-0 prolene in running fashion. Next the superior edge of the mesh was sutured to the conjoined tendon using a 2-0 running Prolene. An additional 2-0 Prolene was used to suture the tail ends of the mesh together re-creating the deep ring. Cord structures are running in a neutral position through the mesh. Next the external abdominal oblique fascia was closed with a 2-0 Vicryl in running fashion to re-create the external inguinal ring. Scarpa's fascia was closed with 3-0 Vicryl in running fashion. Skin was closed with a 4-0 Monocryl subcuticular stitch in running fashion. Dermabond place for dressing. Patient woke from anesthesia and brought to PACU in stable condition. All counts are correct.    Findings: right indirect inguinal hernia  Specimen: none  Blood loss: 10 ml  Local anesthesia: preoperative block  Complications: none  Implant: 3 x 6 in Bard Mesh  Feliciana Rossetti, M.D. General, Bariatric, & Minimally Invasive Surgery [facility], [place] 9:57 AM 02/04/2020

## 2020-02-04 NOTE — Discharge Instructions (Signed)
[facility] _______[facility] ° °UMBILICAL OR INGUINAL HERNIA REPAIR: POST OP INSTRUCTIONS ° °Always review your discharge instruction sheet given to you by the facility where your surgery was performed. °IF YOU HAVE DISABILITY OR FAMILY LEAVE FORMS, YOU MUST BRING THEM TO THE OFFICE FOR PROCESSING.   °DO NOT GIVE THEM TO YOUR DOCTOR. ° °1. A  prescription for pain medication may be given to you upon discharge.  Take your pain medication as prescribed, if needed.  If narcotic pain medicine is not needed, then you may take acetaminophen (Tylenol) or ibuprofen (Advil) as needed. °2. Take your usually prescribed medications unless otherwise directed. °If you need a refill on your pain medication, please contact your pharmacy.  They will contact our office to request authorization. Prescriptions will not be filled after 5 pm or on week-ends. °3. You should follow a light diet the first 24 hours after arrival home, such as soup and crackers, etc.  Be sure to include lots of fluids daily.  Resume your normal diet the day after surgery. °4.Most patients will experience some swelling and bruising around the umbilicus or in the groin and scrotum.  Ice packs and reclining will help.  Swelling and bruising can take several days to resolve.  °6. It is common to experience some constipation if taking pain medication after surgery.  Increasing fluid intake and taking a stool softener (such as Colace) will usually help or prevent this problem from occurring.  A mild laxative (Milk of Magnesia or Miralax) should be taken according to package directions if there are no bowel movements after 48 hours. °7. Unless discharge instructions indicate otherwise, you may remove your bandages 24-48 hours after surgery, and you may shower at that time.  You may have steri-strips (small skin tapes) in place directly over the incision.  These strips should be left on the skin for 7-10 days.  If your surgeon used skin glue on the  incision, you may shower in 24 hours.  The glue will flake off over the next 2-3 weeks.  Any sutures or staples will be removed at the office during your follow-up visit. °8. ACTIVITIES:  You may resume regular (light) daily activities beginning the next day--such as daily self-care, walking, climbing stairs--gradually increasing activities as tolerated.  You may have sexual intercourse when it is comfortable.  Refrain from any heavy lifting or straining until approved by your doctor. ° °a.You may drive when you are no longer taking prescription pain medication, you can comfortably wear a seatbelt, and you can safely maneuver your car and apply brakes. °b.RETURN TO WORK:   °_____________________________________________ ° °9.You should see your doctor in the office for a follow-up appointment approximately 2-3 weeks after your surgery.  Make sure that you call for this appointment within a day or two after you arrive home to insure a convenient appointment time. °10.OTHER INSTRUCTIONS: _________________________ °   _____________________________________ ° °WHEN TO CALL YOUR DOCTOR: °1. Fever over 101.0 °2. Inability to urinate °3. Nausea and/or vomiting °4. Extreme swelling or bruising °5. Continued bleeding from incision. °6. Increased pain, redness, or drainage from the incision ° °The clinic staff is available to answer your questions during regular business hours.  Please don’t hesitate to call and ask to speak to one of the nurses for clinical concerns.  If you have a medical emergency, go to the nearest emergency room or call 911.  A surgeon from [facility] is always on call at the hospital ° ° °  [address], [address], [place], [place]  [address] ? ° [address] [address], [place], [place]   [address] °[phone] ? [phone] ? FAX [phone] °Web site: [email or web address] ° ° ° ° ° °Post Anesthesia Home Care Instructions ° °Activity: °Get plenty of rest for the remainder of the  day. A responsible adult should stay with you for 24 hours following the procedure.  °For the next 24 hours, DO NOT: °-Drive a car °-Operate machinery °-Drink alcoholic beverages °-Take any medication unless instructed by your physician °-Make any legal decisions or sign important papers. ° °Meals: °Start with liquid foods such as gelatin or soup. Progress to regular foods as tolerated. Avoid greasy, spicy, heavy foods. If nausea and/or vomiting occur, drink only clear liquids until the nausea and/or vomiting subsides. Call your physician if vomiting continues. ° °Special Instructions/Symptoms: °Your throat may feel dry or sore from the anesthesia or the breathing tube placed in your throat during surgery. If this causes discomfort, gargle with warm salt water. The discomfort should disappear within 24 hours. ° °If you had a scopolamine patch placed behind your ear for the management of post- operative nausea and/or vomiting: ° °1. The medication in the patch is effective for 72 hours, after which it should be removed.  Wrap patch in a tissue and discard in the trash. Wash hands thoroughly with soap and water. °2. You may remove the patch earlier than 72 hours if you experience unpleasant side effects which may include dry mouth, dizziness or visual disturbances. °3. Avoid touching the patch. Wash your hands with soap and water after contact with the patch. °  ° °

## 2020-02-04 NOTE — Anesthesia Postprocedure Evaluation (Signed)
Anesthesia Post Note  Patient: LEONDRE TAUL  Procedure(s) Performed: RIGHT OPEN INGUINAL HERNIA REPAIR WITH MESH (Right Inguinal)     Patient location during evaluation: PACU Anesthesia Type: General Level of consciousness: awake and alert Pain management: pain level controlled Vital Signs Assessment: post-procedure vital signs reviewed and stable Respiratory status: spontaneous breathing, nonlabored ventilation, respiratory function stable and patient connected to nasal cannula oxygen Cardiovascular status: blood pressure returned to baseline and stable Postop Assessment: no apparent nausea or vomiting Anesthetic complications: no   No complications documented.  Last Vitals:  Vitals:   02/04/20 1030 02/04/20 1045  BP: 134/79   Pulse: 76 78  Resp: 15 15  Temp:    SpO2: 95% 95%    Last Pain:  Vitals:   02/04/20 1100  TempSrc:   PainSc: 0-No pain                 Yaneth Fairbairn DAVID

## 2020-02-04 NOTE — Anesthesia Procedure Notes (Signed)
Procedure Name: LMA Insertion Date/Time: 02/04/2020 8:56 AM Performed by: Francie Massing, CRNA Pre-anesthesia Checklist: Patient identified, Emergency Drugs available, Suction available and Patient being monitored Patient Re-evaluated:Patient Re-evaluated prior to induction Oxygen Delivery Method: Circle system utilized Preoxygenation: Pre-oxygenation with 100% oxygen Induction Type: IV induction Ventilation: Mask ventilation without difficulty LMA: LMA inserted LMA Size: 4.0 Number of attempts: 1 Airway Equipment and Method: Bite block Placement Confirmation: positive ETCO2 Tube secured with: Tape Dental Injury: Teeth and Oropharynx as per pre-operative assessment

## 2020-02-04 NOTE — Progress Notes (Signed)
AssistedDr. Ossey with right, ultrasound guided, transabdominal plane block. Side rails up, monitors on throughout procedure. See vital signs in flow sheet. Tolerated Procedure well.  

## 2020-02-05 ENCOUNTER — Encounter (HOSPITAL_BASED_OUTPATIENT_CLINIC_OR_DEPARTMENT_OTHER): Payer: Self-pay | Admitting: General Surgery

## 2020-03-21 DIAGNOSIS — N182 Chronic kidney disease, stage 2 (mild): Secondary | ICD-10-CM | POA: Diagnosis not present

## 2020-03-21 DIAGNOSIS — Z9861 Coronary angioplasty status: Secondary | ICD-10-CM | POA: Diagnosis not present

## 2020-03-21 DIAGNOSIS — I10 Essential (primary) hypertension: Secondary | ICD-10-CM | POA: Diagnosis not present

## 2020-03-21 DIAGNOSIS — I2510 Atherosclerotic heart disease of native coronary artery without angina pectoris: Secondary | ICD-10-CM | POA: Diagnosis not present

## 2020-07-01 DIAGNOSIS — D649 Anemia, unspecified: Secondary | ICD-10-CM | POA: Diagnosis not present

## 2020-07-01 DIAGNOSIS — E876 Hypokalemia: Secondary | ICD-10-CM | POA: Diagnosis not present

## 2020-07-21 DIAGNOSIS — Z9861 Coronary angioplasty status: Secondary | ICD-10-CM | POA: Diagnosis not present

## 2020-07-21 DIAGNOSIS — I2510 Atherosclerotic heart disease of native coronary artery without angina pectoris: Secondary | ICD-10-CM | POA: Diagnosis not present

## 2020-07-21 DIAGNOSIS — E7849 Other hyperlipidemia: Secondary | ICD-10-CM | POA: Diagnosis not present

## 2020-07-21 DIAGNOSIS — I10 Essential (primary) hypertension: Secondary | ICD-10-CM | POA: Diagnosis not present

## 2020-10-19 DIAGNOSIS — N182 Chronic kidney disease, stage 2 (mild): Secondary | ICD-10-CM | POA: Diagnosis not present

## 2020-10-19 DIAGNOSIS — R072 Precordial pain: Secondary | ICD-10-CM | POA: Diagnosis not present

## 2020-10-19 DIAGNOSIS — Z9861 Coronary angioplasty status: Secondary | ICD-10-CM | POA: Diagnosis not present

## 2020-10-19 DIAGNOSIS — I10 Essential (primary) hypertension: Secondary | ICD-10-CM | POA: Diagnosis not present

## 2021-03-02 DIAGNOSIS — R072 Precordial pain: Secondary | ICD-10-CM | POA: Diagnosis not present

## 2021-03-02 DIAGNOSIS — N182 Chronic kidney disease, stage 2 (mild): Secondary | ICD-10-CM | POA: Diagnosis not present

## 2021-03-02 DIAGNOSIS — I10 Essential (primary) hypertension: Secondary | ICD-10-CM | POA: Diagnosis not present

## 2021-03-02 DIAGNOSIS — Z9861 Coronary angioplasty status: Secondary | ICD-10-CM | POA: Diagnosis not present

## 2021-04-14 ENCOUNTER — Encounter (HOSPITAL_COMMUNITY)
Admission: EM | Disposition: A | Discharge status: Home / Self Care | Payer: Self-pay | Source: Home / Self Care | Attending: Emergency Medicine

## 2021-04-14 ENCOUNTER — Emergency Department (HOSPITAL_COMMUNITY): Payer: Medicare Other

## 2021-04-14 ENCOUNTER — Encounter (HOSPITAL_COMMUNITY): Payer: Self-pay | Admitting: Emergency Medicine

## 2021-04-14 ENCOUNTER — Other Ambulatory Visit: Payer: Self-pay

## 2021-04-14 ENCOUNTER — Ambulatory Visit (HOSPITAL_COMMUNITY)
Admission: EM | Admit: 2021-04-14 | Discharge: 2021-04-14 | Disposition: A | Discharge status: Home / Self Care | Payer: Medicare Other | Attending: Cardiovascular Disease | Admitting: Cardiovascular Disease

## 2021-04-14 DIAGNOSIS — Z20822 Contact with and (suspected) exposure to covid-19: Secondary | ICD-10-CM | POA: Diagnosis not present

## 2021-04-14 DIAGNOSIS — I25110 Atherosclerotic heart disease of native coronary artery with unstable angina pectoris: Secondary | ICD-10-CM | POA: Diagnosis not present

## 2021-04-14 DIAGNOSIS — N182 Chronic kidney disease, stage 2 (mild): Secondary | ICD-10-CM | POA: Insufficient documentation

## 2021-04-14 DIAGNOSIS — R079 Chest pain, unspecified: Secondary | ICD-10-CM

## 2021-04-14 DIAGNOSIS — I129 Hypertensive chronic kidney disease with stage 1 through stage 4 chronic kidney disease, or unspecified chronic kidney disease: Secondary | ICD-10-CM | POA: Diagnosis not present

## 2021-04-14 DIAGNOSIS — I249 Acute ischemic heart disease, unspecified: Secondary | ICD-10-CM | POA: Diagnosis present

## 2021-04-14 DIAGNOSIS — Z955 Presence of coronary angioplasty implant and graft: Secondary | ICD-10-CM | POA: Diagnosis not present

## 2021-04-14 DIAGNOSIS — R0789 Other chest pain: Secondary | ICD-10-CM | POA: Diagnosis not present

## 2021-04-14 DIAGNOSIS — E785 Hyperlipidemia, unspecified: Secondary | ICD-10-CM | POA: Diagnosis not present

## 2021-04-14 DIAGNOSIS — I2510 Atherosclerotic heart disease of native coronary artery without angina pectoris: Secondary | ICD-10-CM | POA: Diagnosis not present

## 2021-04-14 DIAGNOSIS — I200 Unstable angina: Secondary | ICD-10-CM | POA: Diagnosis present

## 2021-04-14 DIAGNOSIS — Z9861 Coronary angioplasty status: Secondary | ICD-10-CM | POA: Diagnosis not present

## 2021-04-14 HISTORY — PX: LEFT HEART CATH AND CORONARY ANGIOGRAPHY: CATH118249

## 2021-04-14 LAB — RESP PANEL BY RT-PCR (FLU A&B, COVID) ARPGX2
Influenza A by PCR: NEGATIVE
Influenza B by PCR: NEGATIVE
SARS Coronavirus 2 by RT PCR: NEGATIVE

## 2021-04-14 LAB — BASIC METABOLIC PANEL
Anion gap: 8 (ref 5–15)
BUN: 11 mg/dL (ref 8–23)
CO2: 24 mmol/L (ref 22–32)
Calcium: 9.3 mg/dL (ref 8.9–10.3)
Chloride: 97 mmol/L — ABNORMAL LOW (ref 98–111)
Creatinine, Ser: 1.45 mg/dL — ABNORMAL HIGH (ref 0.61–1.24)
GFR, Estimated: 52 mL/min — ABNORMAL LOW (ref >60)
Glucose, Bld: 105 mg/dL — ABNORMAL HIGH (ref 70–99)
Potassium: 4.5 mmol/L (ref 3.5–5.1)
Sodium: 129 mmol/L — ABNORMAL LOW (ref 135–145)

## 2021-04-14 LAB — CBC
HCT: 45.1 % (ref 39.0–52.0)
Hemoglobin: 15.5 g/dL (ref 13.0–17.0)
MCH: 31.2 pg (ref 26.0–34.0)
MCHC: 34.4 g/dL (ref 30.0–36.0)
MCV: 90.7 fL (ref 80.0–100.0)
Platelets: 355 10*3/uL (ref 150–400)
RBC: 4.97 MIL/uL (ref 4.22–5.81)
RDW: 12.7 % (ref 11.5–15.5)
WBC: 6.6 10*3/uL (ref 4.0–10.5)
nRBC: 0 % (ref 0.0–0.2)

## 2021-04-14 LAB — TROPONIN I (HIGH SENSITIVITY)
Troponin I (High Sensitivity): 5 ng/L (ref <18)
Troponin I (High Sensitivity): 4 ng/L (ref <18)

## 2021-04-14 SURGERY — LEFT HEART CATH AND CORONARY ANGIOGRAPHY
Anesthesia: LOCAL

## 2021-04-14 MED ORDER — ATORVASTATIN CALCIUM 80 MG PO TABS
80.0000 mg | ORAL_TABLET | Freq: Every day | ORAL | Status: DC
  Administered 2021-04-14: 80 mg via ORAL
  Filled 2021-04-14: qty 2

## 2021-04-14 MED ORDER — LIDOCAINE HCL (PF) 1 % IJ SOLN
INTRAMUSCULAR | Status: AC
  Filled 2021-04-14: qty 30

## 2021-04-14 MED ORDER — LIDOCAINE HCL (PF) 1 % IJ SOLN
INTRAMUSCULAR | Status: DC | PRN
  Administered 2021-04-14: 2 mL via INTRADERMAL

## 2021-04-14 MED ORDER — HYDRALAZINE HCL 20 MG/ML IJ SOLN: 10.0000 mg | INTRAMUSCULAR | Status: DC | PRN

## 2021-04-14 MED ORDER — MIDAZOLAM HCL 2 MG/2ML IJ SOLN
INTRAMUSCULAR | Status: DC | PRN
  Administered 2021-04-14: 1 mg via INTRAVENOUS

## 2021-04-14 MED ORDER — ACETAMINOPHEN 325 MG PO TABS: 650.0000 mg | ORAL_TABLET | ORAL | Status: DC | PRN

## 2021-04-14 MED ORDER — SODIUM CHLORIDE 0.9% FLUSH: 3.0000 mL | INTRAVENOUS | Status: DC | PRN

## 2021-04-14 MED ORDER — SODIUM CHLORIDE 0.9 % IV SOLN: 250.0000 mL | INTRAVENOUS | Status: DC | PRN

## 2021-04-14 MED ORDER — MIDAZOLAM HCL 2 MG/2ML IJ SOLN
INTRAMUSCULAR | Status: AC
  Filled 2021-04-14: qty 2

## 2021-04-14 MED ORDER — HEPARIN SODIUM (PORCINE) 1000 UNIT/ML IJ SOLN
INTRAMUSCULAR | Status: DC | PRN
  Administered 2021-04-14: 4500 [IU] via INTRAVENOUS

## 2021-04-14 MED ORDER — NITROGLYCERIN 0.4 MG SL SUBL
0.4000 mg | SUBLINGUAL_TABLET | Freq: Once | SUBLINGUAL | Status: AC
  Administered 2021-04-14: 0.4 mg via SUBLINGUAL
  Filled 2021-04-14: qty 1

## 2021-04-14 MED ORDER — VERAPAMIL HCL 2.5 MG/ML IV SOLN
INTRAVENOUS | Status: DC | PRN
  Administered 2021-04-14: 10 mL via INTRA_ARTERIAL

## 2021-04-14 MED ORDER — HEPARIN (PORCINE) IN NACL 1000-0.9 UT/500ML-% IV SOLN
INTRAVENOUS | Status: DC | PRN
  Administered 2021-04-14 (×2): 500 mL

## 2021-04-14 MED ORDER — FENTANYL CITRATE (PF) 100 MCG/2ML IJ SOLN
INTRAMUSCULAR | Status: AC
  Filled 2021-04-14: qty 2

## 2021-04-14 MED ORDER — HEPARIN (PORCINE) 25000 UT/250ML-% IV SOLN
950.0000 [IU]/h | INTRAVENOUS | Status: DC
  Administered 2021-04-14: 950 [IU]/h via INTRAVENOUS
  Filled 2021-04-14: qty 250

## 2021-04-14 MED ORDER — NITROGLYCERIN 0.4 MG SL SUBL: 0.4000 mg | SUBLINGUAL_TABLET | SUBLINGUAL | Status: DC | PRN

## 2021-04-14 MED ORDER — SODIUM CHLORIDE 0.9 % IV SOLN: INTRAVENOUS | Status: DC

## 2021-04-14 MED ORDER — HEPARIN SODIUM (PORCINE) 1000 UNIT/ML IJ SOLN
INTRAMUSCULAR | Status: AC
  Filled 2021-04-14: qty 1

## 2021-04-14 MED ORDER — ASPIRIN 81 MG PO CHEW: 81.0000 mg | CHEWABLE_TABLET | ORAL | Status: DC

## 2021-04-14 MED ORDER — SODIUM CHLORIDE 0.9% FLUSH: 3.0000 mL | Freq: Two times a day (BID) | INTRAVENOUS | Status: DC

## 2021-04-14 MED ORDER — ASPIRIN 81 MG PO CHEW
81.0000 mg | CHEWABLE_TABLET | ORAL | Status: AC
  Administered 2021-04-14: 81 mg via ORAL
  Filled 2021-04-14: qty 1

## 2021-04-14 MED ORDER — VERAPAMIL HCL 2.5 MG/ML IV SOLN
INTRAVENOUS | Status: AC
  Filled 2021-04-14: qty 2

## 2021-04-14 MED ORDER — IOHEXOL 350 MG/ML SOLN
INTRAVENOUS | Status: DC | PRN
  Administered 2021-04-14: 30 mL

## 2021-04-14 MED ORDER — FENTANYL CITRATE (PF) 100 MCG/2ML IJ SOLN
INTRAMUSCULAR | Status: DC | PRN
  Administered 2021-04-14: 25 ug via INTRAVENOUS

## 2021-04-14 MED ORDER — ONDANSETRON HCL 4 MG/2ML IJ SOLN: 4.0000 mg | Freq: Four times a day (QID) | INTRAMUSCULAR | Status: DC | PRN

## 2021-04-14 MED ORDER — LABETALOL HCL 5 MG/ML IV SOLN: 10.0000 mg | INTRAVENOUS | Status: DC | PRN

## 2021-04-14 MED ORDER — HEPARIN (PORCINE) IN NACL 1000-0.9 UT/500ML-% IV SOLN
INTRAVENOUS | Status: AC
  Filled 2021-04-14: qty 1000

## 2021-04-14 MED ORDER — HEPARIN BOLUS VIA INFUSION
4000.0000 [IU] | Freq: Once | INTRAVENOUS | Status: AC
  Administered 2021-04-14: 4000 [IU] via INTRAVENOUS
  Filled 2021-04-14: qty 4000

## 2021-04-14 MED ORDER — METOPROLOL TARTRATE 25 MG PO TABS
12.5000 mg | ORAL_TABLET | Freq: Two times a day (BID) | ORAL | Status: DC
  Administered 2021-04-14: 12.5 mg via ORAL
  Filled 2021-04-14: qty 1

## 2021-04-14 SURGICAL SUPPLY — 9 items
CATH 5FR JL3.5 JR4 ANG PIG MP (CATHETERS) ×2 IMPLANT
DEVICE RAD COMP TR BAND LRG (VASCULAR PRODUCTS) ×2 IMPLANT
GLIDESHEATH SLEND SS 6F .021 (SHEATH) ×2 IMPLANT
GUIDEWIRE INQWIRE 1.5J.035X260 (WIRE) ×1 IMPLANT
INQWIRE 1.5J .035X260CM (WIRE) ×2
KIT HEART LEFT (KITS) ×2 IMPLANT
PACK CARDIAC CATHETERIZATION (CUSTOM PROCEDURE TRAY) ×2 IMPLANT
SHEATH PROBE COVER 6X72 (BAG) ×2 IMPLANT
TRANSDUCER W/STOPCOCK (MISCELLANEOUS) ×2 IMPLANT

## 2021-04-14 NOTE — ED Notes (Signed)
Report given to [facility] in Cath lab

## 2021-04-14 NOTE — Progress Notes (Signed)
ANTICOAGULATION CONSULT NOTE - Initial Consult  Pharmacy Consult for heparin Indication: chest pain/ACS  No Known Allergies  Patient Measurements: Height: 5\' 7"  (170.2 cm) Weight: 77.1 kg (170 lb) IBW/kg (Calculated) : 66.1 Heparin Dosing Weight: TBW  Vital Signs: Temp: 98.7 F (37.1 C) (11/04 0454) BP: 149/89 (11/04 0930) Pulse Rate: 78 (11/04 0930)  Labs: Recent Labs    04/14/21 0513 04/14/21 0650  HGB 15.5  --   HCT 45.1  --   PLT 355  --   CREATININE 1.45*  --   TROPONINIHS 5 4    Estimated Creatinine Clearance: 43.7 mL/min (A) (by C-G formula based on SCr of 1.45 mg/dL (H)).   Medical History: Past Medical History:  Diagnosis Date   Arthritis    right elbow   CAD (coronary artery disease) cardiologist--- dr 13/04/22   several cardiac cath's with PTCA and stenting from 2000, 2007, 2014 (total 3 DES)   Chronic gout    02-02-2020 per pt last episode 02/ 2021 right knee   Chronic stable angina (HCC)    GERD (gastroesophageal reflux disease)    Hyperlipidemia    Hypertension    RBBB (right bundle branch block)    Right inguinal hernia    S/P drug eluting coronary stent placement     Assessment: 50 YOM presenting with CP, hx CAD with PCI, he is not on anticoagulation PTA, CBC wnl.    Goal of Therapy:  Heparin level 0.3-0.7 units/ml Monitor platelets by anticoagulation protocol: Yes   Plan:  Heparin 4000 units IV x 1, and gtt at 950 units/hr F/u 8 hour heparin level F/u cards eval and recs  60, PharmD Clinical Pharmacist ED Pharmacist Phone # [phone] 04/14/2021 10:29 AM

## 2021-04-14 NOTE — ED Triage Notes (Signed)
Patient with chest pain, squeezing sensation in the left chest. Patient denies any nausea, vomiting, shortness of breath.  He states that the pain also has some stabbing sensations, that comes and goes.  He took 262mg  ASA before coming to ED.

## 2021-04-14 NOTE — ED Provider Notes (Signed)
[facility] EMERGENCY DEPARTMENT Provider Note   CSN: [id number] Arrival date & time: 04/14/21  0449     History Chief Complaint  Patient presents with   Chest Pain    [name] is a 70 y.o. male.  CAD with multiple stents most recent 2015 presents the ED complaining of a left-sided squeezing chest pain with occasional fleeting sharp stabbing pain which began overnight around 3:30 AM while he was still in bed.  It is present at rest, not exacerbated with movement, and patient is uncertain whether or not it improves with rest.  He took 325 mg of aspirin, but has not taken any nitro. He reports that his chest pain feels similar to according to he required prior stenting and states that when he needed stents placed in the past his initial work-up was typically unrevealing as well. He does endorse some angina intermittently in the past which usually only lasts approximately an hour however this time his pain is more persistent  It is not reproducible with palpation.  He denies any lower extremity edema and no recent travel.  He denies any shortness of breath no radiation of pain, no nausea or diaphoresis.  He is not having any dizziness either. Home medications include lisinopril metoprolol and a statin, he is not on a blood thinner.   Chest Pain     Past Medical History:  Diagnosis Date   Arthritis    right elbow   CAD (coronary artery disease) cardiologist--- dr Lonia Mad   several cardiac cath's with PTCA and stenting from 2000, 2007, 2014 (total 3 DES)   Chronic gout    02-02-2020 per pt last episode 02/ 2021 right knee   Chronic stable angina (HCC)    GERD (gastroesophageal reflux disease)    Hyperlipidemia    Hypertension    RBBB (right bundle branch block)    Right inguinal hernia    S/P drug eluting coronary stent placement     Patient Active Problem List   Diagnosis Date Noted   Chest pain 07/04/2016   Unstable angina (HCC) 07/04/2016   Acute  coronary syndrome (HCC) 03/04/2015   Chest pain at rest 11/11/2013   CAD (coronary artery disease)     Past Surgical History:  Procedure Laterality Date   CARDIAC CATHETERIZATION  05/03/2006   patent D1 stent with moderate prestent ostial stenosis,  patent LAD stent   CORONARY ANGIOPLASTY  11/21/007   PTCA balloon to ostial D1   CORONARY ANGIOPLASTY WITH STENT PLACEMENT  2000   dr Melchor Amour   PTCA and stent to mLAD   CORONARY ANGIOPLASTY WITH STENT PLACEMENT  01-18-2006  dr Melchor Amour    PTCA and to ostial diagonal/ mLAD and DES x2   INGUINAL HERNIA REPAIR Right 02/04/2020   Procedure: RIGHT OPEN INGUINAL HERNIA REPAIR WITH MESH;  Surgeon: Kinsinger, De Blanch, MD;  Location: [facility];  Service: General;  Laterality: Right;   LEFT HEART CATHETERIZATION WITH CORONARY ANGIOGRAM N/A 02/04/2013   Procedure: LEFT HEART CATHETERIZATION WITH CORONARY ANGIOGRAM;  Surgeon: [name], MD;  Location: [facility] CATH LAB;  Service: Cardiovascular;  Laterality: N/A;   LEFT HEART CATHETERIZATION WITH CORONARY ANGIOGRAM N/A 02/11/2013   Procedure: LEFT HEART CATHETERIZATION WITH CORONARY ANGIOGRAM;  Surgeon: [name], MD;  Location: [facility] CATH LAB;  Service: Cardiovascular;  Laterality: N/A;   LEFT HEART CATHETERIZATION WITH CORONARY ANGIOGRAM N/A 11/12/2013   Procedure: LEFT HEART CATHETERIZATION WITH CORONARY ANGIOGRAM;  Surgeon: [name],  MD;  Location: [facility] CATH LAB;  Service: Cardiovascular;  Laterality: N/A;   PERCUTANEOUS CORONARY INTERVENTION-BALLOON ONLY  02/04/2013   Procedure: PERCUTANEOUS CORONARY INTERVENTION-BALLOON ONLY;  Surgeon: [name], MD;  Location: [facility] CATH LAB;  Service: Cardiovascular;;       History reviewed. No pertinent family history.  Social History   Tobacco Use   Smoking status: Never   Smokeless tobacco: Never  Vaping Use   Vaping Use: Never used  Substance Use Topics   Alcohol use: Yes    Comment: occasional   Drug use: Never    Home  Medications Prior to Admission medications   Medication Sig Start Date End Date Taking? Authorizing Provider  allopurinol (ZYLOPRIM) 100 MG tablet Take 100 mg by mouth at bedtime.  05/31/16   [provider]  amLODipine (NORVASC) 5 MG tablet Take 5 mg by mouth every evening.     [provider]  aspirin EC 81 MG tablet Take 81 mg by mouth daily.    [provider]  clopidogrel (PLAVIX) 75 MG tablet Take 1 tablet (75 mg total) by mouth daily with breakfast. Patient taking differently: Take 75 mg by mouth at bedtime.  03/09/13   Rinaldo Cloud, MD  colchicine 0.6 MG tablet Take 0.6 mg by mouth daily as needed (for gout).  05/31/16   [provider]  famotidine (PEPCID) 20 MG tablet Take 1 tablet (20 mg total) by mouth 2 (two) times daily as needed for heartburn. 07/06/16   Orpah Cobb, MD  ibuprofen (ADVIL) 800 MG tablet Take 1 tablet (800 mg total) by mouth every 8 (eight) hours as needed. 02/04/20   Kinsinger, De Blanch, MD  isosorbide mononitrate (IMDUR) 60 MG 24 hr tablet Take 60 mg by mouth 2 (two) times daily.    [provider]  lisinopril (PRINIVIL,ZESTRIL) 10 MG tablet Take 1 tablet (10 mg total) by mouth daily. Patient taking differently: Take 10 mg by mouth daily.  03/05/15   Orpah Cobb, MD  metoprolol tartrate (LOPRESSOR) 25 MG tablet Take 0.5 tablets (12.5 mg total) by mouth 2 (two) times daily. Patient taking differently: Take 25 mg by mouth daily.  03/05/15   Orpah Cobb, MD  nitroGLYCERIN (NITROSTAT) 0.4 MG SL tablet Place 1 tablet (0.4 mg total) under the tongue every 5 (five) minutes as needed for chest pain. Patient not taking: Reported on 02/02/2020 11/12/13   Orpah Cobb, MD  oxyCODONE (OXY IR/ROXICODONE) 5 MG immediate release tablet Take 1 tablet (5 mg total) by mouth every 6 (six) hours as needed for severe pain. 02/04/20   Kinsinger, De Blanch, MD  rosuvastatin (CRESTOR) 40 MG tablet Take 40 mg by mouth at bedtime.     [provider]    Allergies    Patient has no known allergies.  Review of Systems   Review of Systems  Constitutional: Negative.   HENT: Negative.    Eyes: Negative.   Respiratory: Negative.    Cardiovascular:  Positive for chest pain.  Gastrointestinal: Negative.   Endocrine: Negative.   Genitourinary: Negative.   Musculoskeletal: Negative.   Skin: Negative.   Allergic/Immunologic: Negative.   Neurological: Negative.   Hematological: Negative.   Psychiatric/Behavioral: Negative.     Physical Exam Updated Vital Signs BP (!) 152/97   Pulse 86   Temp 98.7 F (37.1 C)   Resp 20   Ht 5\' 7"  (1.702 m)   Wt 77.1 kg   SpO2 97%   BMI 26.63 kg/m  Physical Exam Constitutional:      General: He is not in acute distress.    Appearance: He is well-developed and normal weight. He is not ill-appearing, toxic-appearing or diaphoretic.  HENT:     Head: Normocephalic and atraumatic.  Cardiovascular:     Rate and Rhythm: Normal rate. Rhythm irregular.  Pulmonary:     Effort: Pulmonary effort is normal. No tachypnea, accessory muscle usage or respiratory distress.     Breath sounds: Normal breath sounds. No stridor.  Chest:     Chest wall: No mass, deformity, tenderness, crepitus or edema. There is no dullness to percussion.  Abdominal:     General: Bowel sounds are normal.     Palpations: Abdomen is soft.     Tenderness: There is no abdominal tenderness. There is no rebound.  Musculoskeletal:        General: Normal range of motion.     Right lower leg: No tenderness. No edema.     Left lower leg: No tenderness. No edema.  Skin:    General: Skin is warm and dry.     Coloration: Skin is not cyanotic or pale.     Findings: No ecchymosis, erythema or rash.  Neurological:     General: No focal deficit present.     Mental Status: He is alert and oriented to person, place, and time.     Motor: No weakness.  Psychiatric:        Mood and Affect: Mood normal. Mood is not anxious.         Behavior: Behavior normal. Behavior is not agitated.    ED Results / Procedures / Treatments   Labs (all labs ordered are listed, but only abnormal results are displayed) Labs Reviewed  BASIC METABOLIC PANEL - Abnormal; Notable for the following components:      Result Value   Sodium 129 (*)    Chloride 97 (*)    Glucose, Bld 105 (*)    Creatinine, Ser 1.45 (*)    GFR, Estimated 52 (*)    All other components within normal limits  CBC  TROPONIN I (HIGH SENSITIVITY)  TROPONIN I (HIGH SENSITIVITY)    EKG EKG: Sinus rhythm with marked sinus arrhythmia, right bundle branch block, left posterior fascicular block, bifascicular block. Appears similar to prior.   Radiology DG Chest 2 View  Result Date: 04/14/2021 CLINICAL DATA:  70 year old male with history of chest pain and squeezing sensation in the chest. EXAM: CHEST - 2 VIEW COMPARISON:  Chest x-ray 08/11/2016. FINDINGS: Lung volumes are normal. No consolidative airspace disease. No pleural effusions. No pneumothorax. No pulmonary nodule or mass noted. Pulmonary vasculature and the cardiomediastinal silhouette are within normal limits. IMPRESSION: No radiographic evidence of acute cardiopulmonary disease. Electronically Signed   By: Trudie Reed M.D.   On: 04/14/2021 05:56    Procedures Procedures   Medications Ordered in ED Medications  nitroGLYCERIN (NITROSTAT) SL tablet 0.4 mg (has no administration in time range)  nitroGLYCERIN (NITROSTAT) SL tablet 0.4 mg (0.4 mg Sublingual Given 04/14/21 0851)    ED Course  I have reviewed the triage vital signs and the nursing notes.  Pertinent labs & imaging results that were available during my care of the patient were reviewed by me and considered in my medical decision making (see chart for details).    MDM Rules/Calculators/A&P  70 year old male with a history of CAD multiple stents presented with complaints of constant left-sided squeezing  chest pain with occasional fleeting sharp stabbing pain that began this morning.  Pain is not exacerbated with movement, possibly improved with rest but still persists, and patient has not taken any nitro.  He does endorse a history of similar pain which has required stenting in the past.  Likely atypical angina.  No shortness of breath, radiation of pain, nausea, diaphoresis.   On physical exam patient is alert oriented speaking in full sentences not requiring supplemental oxygen, has no diaphoresis.  Heart is regular rate and regular rhythm, pulses are intact.  Skin well perfused warm and dry with no lower extremity edema.  Does not appear to be in acute distress. Initial labs showed a sodium of 129, CBC grossly normal.  His chest x-ray was normal as well with no acute cardiopulmonary findings seen.  EKG appears unchanged from prior, and troponin has remained within normal limits X2.  He reports that this pain feels similar to when he required stenting in the past and that during previous stent placement his initial work-ups were usually nonrevealing.  For this reason we will discussed with his cardiologist for further recommendations.  Patient has already taken 325 mg of aspirin. Discussed with Dr. Algie Coffer who plans to admit to Cardiology for heart catheterization. Will keep NPO.   Final Clinical Impression(s) / ED Diagnoses Final diagnoses:  Chest pain, unspecified type  Coronary artery disease involving native heart, unspecified vessel or lesion type, unspecified whether angina present    Rx / DC Orders ED Discharge Orders     None        Adron Bene, MD 04/14/21 4462    Milagros Loll, MD 04/15/21 2028

## 2021-04-14 NOTE — H&P (Signed)
Referring Physician: Algie Coffer, MD  [name] is an 70 y.o. male.                       Chief Complaint: Chest pain  HPI: 70 years old male with PMH of CAD, coronary stents, HTN and HLD has recurrent left sided squeezing chest pain without radiation,nausea, diaphoresis or shortness of breath but similar to pain when he had stent in coronary artery in 2015. His troponin levels are normal. No cough, fever or chills. Today's chest x-ray is unremarkable. He had CKD, II.   Past Medical History:  Diagnosis Date   Arthritis    right elbow   CAD (coronary artery disease) cardiologist--- dr Lonia Mad   several cardiac cath's with PTCA and stenting from 2000, 2007, 2014 (total 3 DES)   Chronic gout    02-02-2020 per pt last episode 02/ 2021 right knee   Chronic stable angina (HCC)    GERD (gastroesophageal reflux disease)    Hyperlipidemia    Hypertension    RBBB (right bundle branch block)    Right inguinal hernia    S/P drug eluting coronary stent placement       Past Surgical History:  Procedure Laterality Date   CARDIAC CATHETERIZATION  05/03/2006   patent D1 stent with moderate prestent ostial stenosis,  patent LAD stent   CORONARY ANGIOPLASTY  11/21/007   PTCA balloon to ostial D1   CORONARY ANGIOPLASTY WITH STENT PLACEMENT  2000   dr Melchor Amour   PTCA and stent to mLAD   CORONARY ANGIOPLASTY WITH STENT PLACEMENT  01-18-2006  dr Melchor Amour    PTCA and to ostial diagonal/ mLAD and DES x2   INGUINAL HERNIA REPAIR Right 02/04/2020   Procedure: RIGHT OPEN INGUINAL HERNIA REPAIR WITH MESH;  Surgeon: Kinsinger, De Blanch, MD;  Location: [facility] [place];  Service: General;  Laterality: Right;   LEFT HEART CATHETERIZATION WITH CORONARY ANGIOGRAM N/A 02/04/2013   Procedure: LEFT HEART CATHETERIZATION WITH CORONARY ANGIOGRAM;  Surgeon: [name], MD;  Location: [facility] CATH [facility];  Service: Cardiovascular;  Laterality: N/A;   LEFT HEART CATHETERIZATION WITH CORONARY ANGIOGRAM N/A  02/11/2013   Procedure: LEFT HEART CATHETERIZATION WITH CORONARY ANGIOGRAM;  Surgeon: [name], MD;  Location: [facility] CATH [facility];  Service: Cardiovascular;  Laterality: N/A;   LEFT HEART CATHETERIZATION WITH CORONARY ANGIOGRAM N/A 11/12/2013   Procedure: LEFT HEART CATHETERIZATION WITH CORONARY ANGIOGRAM;  Surgeon: [name], MD;  Location: [facility] CATH [facility];  Service: Cardiovascular;  Laterality: N/A;   PERCUTANEOUS CORONARY INTERVENTION-BALLOON ONLY  02/04/2013   Procedure: PERCUTANEOUS CORONARY INTERVENTION-BALLOON ONLY;  Surgeon: [name], MD;  Location: [facility];  Service: Cardiovascular;;    History reviewed. No pertinent family history. Social History:  reports that he has never smoked. He has never used smokeless tobacco. He reports current alcohol use. He reports that he does not use drugs.  Allergies: No Known Allergies  (Not in a hospital admission)   Results for orders placed or performed during the hospital encounter of 04/14/21 (from the past 48 hour(s))  Basic metabolic panel     Status: Abnormal   Collection Time: 04/14/21  5:13 AM  Result Value Ref Range   Sodium 129 (L) 135 - 145 mmol/L   Potassium 4.5 3.5 - 5.1 mmol/L   Chloride 97 (L) 98 - 111 mmol/L   CO2 24 22 - 32 mmol/L   Glucose, Bld 105 (H) 70 - 99 mg/dL  Comment: Glucose reference range applies only to samples taken after fasting for at least 8 hours.   BUN 11 8 - 23 mg/dL   Creatinine, Ser 8.29 (H) 0.61 - 1.24 mg/dL   Calcium 9.3 8.9 - 56.2 mg/dL   GFR, Estimated 52 (L) >60 mL/min    Comment: (NOTE) Calculated using the CKD-EPI Creatinine Equation (2021)    Anion gap 8 5 - 15    Comment: Performed at [facility], [address] [place], [place] [address]  CBC     Status: None   Collection Time: 04/14/21  5:13 AM  Result Value Ref Range   WBC 6.6 4.0 - 10.5 K/uL   RBC 4.97 4.22 - 5.81 MIL/uL   Hemoglobin 15.5 13.0 - 17.0 g/dL   HCT 57.8 46.9 - 62.9 %   MCV 90.7 80.0 - 100.0 fL   MCH  31.2 26.0 - 34.0 pg   MCHC 34.4 30.0 - 36.0 g/dL   RDW 52.8 41.3 - 24.4 %   Platelets 355 150 - 400 K/uL   nRBC 0.0 0.0 - 0.2 %    Comment: Performed at [facility], [address] [place], [place] [address]  Troponin I (High Sensitivity)     Status: None   Collection Time: 04/14/21  5:13 AM  Result Value Ref Range   Troponin I (High Sensitivity) 5 <18 ng/L    Comment: (NOTE) Elevated high sensitivity troponin I (hsTnI) values and significant  changes across serial measurements may suggest ACS but many other  chronic and acute conditions are known to elevate hsTnI results.  Refer to the "Links" section for chest pain algorithms and additional  guidance. Performed at [facility], [address] [place], [place] [address]   Troponin I (High Sensitivity)     Status: None   Collection Time: 04/14/21  6:50 AM  Result Value Ref Range   Troponin I (High Sensitivity) 4 <18 ng/L    Comment: (NOTE) Elevated high sensitivity troponin I (hsTnI) values and significant  changes across serial measurements may suggest ACS but many other  chronic and acute conditions are known to elevate hsTnI results.  Refer to the "Links" section for chest pain algorithms and additional  guidance. Performed at [facility], [address], [place], [place] [address]    DG Chest 2 View  Result Date: 04/14/2021 CLINICAL DATA:  70 year old male with history of chest pain and squeezing sensation in the chest. EXAM: CHEST - 2 VIEW COMPARISON:  Chest x-ray 08/11/2016. FINDINGS: Lung volumes are normal. No consolidative airspace disease. No pleural effusions. No pneumothorax. No pulmonary nodule or mass noted. Pulmonary vasculature and the cardiomediastinal silhouette are within normal limits. IMPRESSION: No radiographic evidence of acute cardiopulmonary disease. Electronically Signed   By: Trudie Reed M.D.   On: 04/14/2021 05:56    Review Of Systems Constitutional: No fever, chills, weight  loss or gain. Eyes: No vision change, wears glasses. No discharge or pain. Ears: No hearing loss, No tinnitus. Respiratory: No asthma, COPD, pneumonias. No shortness of breath. No hemoptysis. Cardiovascular: Positive chest pain, palpitation, leg edema. Gastrointestinal: No nausea, vomiting, diarrhea, constipation. No GI bleed. No hepatitis. Genitourinary: No dysuria, hematuria, kidney stone. No incontinance. Neurological: No headache, stroke, seizures.  Psychiatry: No psych facility admission for anxiety, depression, suicide. No detox. Skin: No rash. Musculoskeletal: Positive joint pain, no fibromyalgia. No neck pain, back pain. Lymphadenopathy: No lymphadenopathy. Hematology: No anemia or easy bruising.   Blood  pressure (!) 149/89, pulse 78, temperature 98.7 F (37.1 C), resp. rate 18, height 5\' 7"  (1.702 m), weight 77.1 kg, SpO2 96 %. Body mass index is 26.63 kg/m. General appearance: alert, cooperative, appears stated age and no distress Head: Normocephalic, atraumatic. Eyes: Blue eyes, pink conjunctiva, corneas clear. PERRL, EOM's intact. Neck: No adenopathy, no carotid bruit, no JVD, supple, symmetrical, trachea midline and thyroid not enlarged. Resp: Clear to auscultation bilaterally. Cardio: Regular rate and rhythm, S1, S2 normal, II/VI systolic murmur, no click, rub or gallop GI: Soft, non-tender; bowel sounds normal; no organomegaly. Extremities: No edema, cyanosis or clubbing. Skin: Warm and dry.  Neurologic: Alert and oriented X 3, normal strength.  Assessment/Plan Unstable angina CAD S/P stent in LAD and diagonal vessel HTN HLD  Plan: Nuclear stress test v/s cardiac cath.  Time spent: Review of old records, Lab, x-rays, EKG, other cardiac tests, examination, discussion with patient/Doctor over 70 minutes.  Birdie Riddle, MD  04/14/2021, 9:55 AM

## 2021-04-14 NOTE — ED Provider Notes (Signed)
Emergency Medicine Provider Triage Evaluation Note  [name] , a 70 y.o. male  was evaluated in triage.  Pt complains of chest pain.  Onset 2 hours ago.  Denies SOB, radiating pain, nausea, diaphoresis.  Took 324mg  ASA PTA.  Has hx of CAD with multiple stents.  Review of Systems  Positive: Chest pain Negative: SOB, nausea  Physical Exam  BP (!) 157/94 (BP Location: Right Arm)   Pulse 90   Temp 98.7 F (37.1 C)   Resp 17   Ht 5\' 7"  (1.702 m)   Wt 77.1 kg   SpO2 99%   BMI 26.63 kg/m  Gen:   Awake, no distress   Resp:  Normal effort  MSK:   Moves extremities without difficulty  Other:    Medical Decision Making  Medically screening exam initiated at 5:02 AM.  Appropriate orders placed.  was informed that the remainder of the evaluation will be completed by another provider, this initial triage assessment does not replace that evaluation, and the importance of remaining in the ED until their evaluation is complete.  CP   , PA-C 04/14/21 0503    Roxy Horseman, MD 04/14/21 [phone]

## 2021-06-14 DIAGNOSIS — I10 Essential (primary) hypertension: Secondary | ICD-10-CM | POA: Diagnosis not present

## 2021-06-14 DIAGNOSIS — I2510 Atherosclerotic heart disease of native coronary artery without angina pectoris: Secondary | ICD-10-CM | POA: Diagnosis not present

## 2021-06-14 DIAGNOSIS — Z9861 Coronary angioplasty status: Secondary | ICD-10-CM | POA: Diagnosis not present

## 2021-09-27 DIAGNOSIS — Z79899 Other long term (current) drug therapy: Secondary | ICD-10-CM | POA: Diagnosis not present

## 2021-09-27 DIAGNOSIS — D649 Anemia, unspecified: Secondary | ICD-10-CM | POA: Diagnosis not present

## 2021-09-27 DIAGNOSIS — E7849 Other hyperlipidemia: Secondary | ICD-10-CM | POA: Diagnosis not present

## 2021-09-27 DIAGNOSIS — E876 Hypokalemia: Secondary | ICD-10-CM | POA: Diagnosis not present

## 2021-10-10 DIAGNOSIS — I2510 Atherosclerotic heart disease of native coronary artery without angina pectoris: Secondary | ICD-10-CM | POA: Diagnosis not present

## 2021-10-10 DIAGNOSIS — I10 Essential (primary) hypertension: Secondary | ICD-10-CM | POA: Diagnosis not present

## 2021-10-10 DIAGNOSIS — Z9861 Coronary angioplasty status: Secondary | ICD-10-CM | POA: Diagnosis not present

## 2021-10-19 DIAGNOSIS — R1031 Right lower quadrant pain: Secondary | ICD-10-CM | POA: Diagnosis not present

## 2021-10-26 ENCOUNTER — Other Ambulatory Visit: Payer: Self-pay | Admitting: General Surgery

## 2021-10-26 DIAGNOSIS — R1031 Right lower quadrant pain: Secondary | ICD-10-CM

## 2021-11-09 DIAGNOSIS — M9903 Segmental and somatic dysfunction of lumbar region: Secondary | ICD-10-CM | POA: Diagnosis not present

## 2021-11-09 DIAGNOSIS — S39012A Strain of muscle, fascia and tendon of lower back, initial encounter: Secondary | ICD-10-CM | POA: Diagnosis not present

## 2021-11-09 DIAGNOSIS — M9905 Segmental and somatic dysfunction of pelvic region: Secondary | ICD-10-CM | POA: Diagnosis not present

## 2021-11-09 DIAGNOSIS — M5136 Other intervertebral disc degeneration, lumbar region: Secondary | ICD-10-CM | POA: Diagnosis not present

## 2021-11-09 DIAGNOSIS — M47814 Spondylosis without myelopathy or radiculopathy, thoracic region: Secondary | ICD-10-CM | POA: Diagnosis not present

## 2021-11-09 DIAGNOSIS — M9902 Segmental and somatic dysfunction of thoracic region: Secondary | ICD-10-CM | POA: Diagnosis not present

## 2021-11-13 DIAGNOSIS — M5136 Other intervertebral disc degeneration, lumbar region: Secondary | ICD-10-CM | POA: Diagnosis not present

## 2021-11-13 DIAGNOSIS — M9902 Segmental and somatic dysfunction of thoracic region: Secondary | ICD-10-CM | POA: Diagnosis not present

## 2021-11-13 DIAGNOSIS — S39012A Strain of muscle, fascia and tendon of lower back, initial encounter: Secondary | ICD-10-CM | POA: Diagnosis not present

## 2021-11-13 DIAGNOSIS — M9905 Segmental and somatic dysfunction of pelvic region: Secondary | ICD-10-CM | POA: Diagnosis not present

## 2021-11-13 DIAGNOSIS — M9903 Segmental and somatic dysfunction of lumbar region: Secondary | ICD-10-CM | POA: Diagnosis not present

## 2021-11-13 DIAGNOSIS — M47814 Spondylosis without myelopathy or radiculopathy, thoracic region: Secondary | ICD-10-CM | POA: Diagnosis not present

## 2021-11-16 DIAGNOSIS — S39012A Strain of muscle, fascia and tendon of lower back, initial encounter: Secondary | ICD-10-CM | POA: Diagnosis not present

## 2021-11-16 DIAGNOSIS — M47814 Spondylosis without myelopathy or radiculopathy, thoracic region: Secondary | ICD-10-CM | POA: Diagnosis not present

## 2021-11-16 DIAGNOSIS — M9905 Segmental and somatic dysfunction of pelvic region: Secondary | ICD-10-CM | POA: Diagnosis not present

## 2021-11-16 DIAGNOSIS — M9903 Segmental and somatic dysfunction of lumbar region: Secondary | ICD-10-CM | POA: Diagnosis not present

## 2021-11-16 DIAGNOSIS — M5136 Other intervertebral disc degeneration, lumbar region: Secondary | ICD-10-CM | POA: Diagnosis not present

## 2021-11-16 DIAGNOSIS — M9902 Segmental and somatic dysfunction of thoracic region: Secondary | ICD-10-CM | POA: Diagnosis not present

## 2021-11-20 DIAGNOSIS — M9902 Segmental and somatic dysfunction of thoracic region: Secondary | ICD-10-CM | POA: Diagnosis not present

## 2021-11-20 DIAGNOSIS — M47814 Spondylosis without myelopathy or radiculopathy, thoracic region: Secondary | ICD-10-CM | POA: Diagnosis not present

## 2021-11-20 DIAGNOSIS — M9903 Segmental and somatic dysfunction of lumbar region: Secondary | ICD-10-CM | POA: Diagnosis not present

## 2021-11-20 DIAGNOSIS — M9905 Segmental and somatic dysfunction of pelvic region: Secondary | ICD-10-CM | POA: Diagnosis not present

## 2021-11-20 DIAGNOSIS — S39012A Strain of muscle, fascia and tendon of lower back, initial encounter: Secondary | ICD-10-CM | POA: Diagnosis not present

## 2021-11-20 DIAGNOSIS — M5136 Other intervertebral disc degeneration, lumbar region: Secondary | ICD-10-CM | POA: Diagnosis not present

## 2021-11-22 ENCOUNTER — Ambulatory Visit
Admission: RE | Admit: 2021-11-22 | Discharge: 2021-11-22 | Disposition: A | Discharge status: Home / Self Care | Payer: Medicare Other | Source: Ambulatory Visit | Attending: General Surgery | Admitting: General Surgery

## 2021-11-22 DIAGNOSIS — K4090 Unilateral inguinal hernia, without obstruction or gangrene, not specified as recurrent: Secondary | ICD-10-CM | POA: Diagnosis not present

## 2021-11-22 DIAGNOSIS — R1031 Right lower quadrant pain: Secondary | ICD-10-CM

## 2021-11-22 MED ORDER — IOPAMIDOL (ISOVUE-300) INJECTION 61%
100.0000 mL | Freq: Once | INTRAVENOUS | Status: AC | PRN
  Administered 2021-11-22: 100 mL via INTRAVENOUS

## 2021-11-27 DIAGNOSIS — S39012A Strain of muscle, fascia and tendon of lower back, initial encounter: Secondary | ICD-10-CM | POA: Diagnosis not present

## 2021-11-27 DIAGNOSIS — M47814 Spondylosis without myelopathy or radiculopathy, thoracic region: Secondary | ICD-10-CM | POA: Diagnosis not present

## 2021-11-27 DIAGNOSIS — M9902 Segmental and somatic dysfunction of thoracic region: Secondary | ICD-10-CM | POA: Diagnosis not present

## 2021-11-27 DIAGNOSIS — M9903 Segmental and somatic dysfunction of lumbar region: Secondary | ICD-10-CM | POA: Diagnosis not present

## 2021-11-27 DIAGNOSIS — M9905 Segmental and somatic dysfunction of pelvic region: Secondary | ICD-10-CM | POA: Diagnosis not present

## 2021-11-27 DIAGNOSIS — M5136 Other intervertebral disc degeneration, lumbar region: Secondary | ICD-10-CM | POA: Diagnosis not present

## 2021-12-07 DIAGNOSIS — M5136 Other intervertebral disc degeneration, lumbar region: Secondary | ICD-10-CM | POA: Diagnosis not present

## 2021-12-07 DIAGNOSIS — M9902 Segmental and somatic dysfunction of thoracic region: Secondary | ICD-10-CM | POA: Diagnosis not present

## 2021-12-07 DIAGNOSIS — M9905 Segmental and somatic dysfunction of pelvic region: Secondary | ICD-10-CM | POA: Diagnosis not present

## 2021-12-07 DIAGNOSIS — S39012A Strain of muscle, fascia and tendon of lower back, initial encounter: Secondary | ICD-10-CM | POA: Diagnosis not present

## 2021-12-07 DIAGNOSIS — M9903 Segmental and somatic dysfunction of lumbar region: Secondary | ICD-10-CM | POA: Diagnosis not present

## 2021-12-07 DIAGNOSIS — M47814 Spondylosis without myelopathy or radiculopathy, thoracic region: Secondary | ICD-10-CM | POA: Diagnosis not present

## 2021-12-14 DIAGNOSIS — M47814 Spondylosis without myelopathy or radiculopathy, thoracic region: Secondary | ICD-10-CM | POA: Diagnosis not present

## 2021-12-14 DIAGNOSIS — M9902 Segmental and somatic dysfunction of thoracic region: Secondary | ICD-10-CM | POA: Diagnosis not present

## 2021-12-14 DIAGNOSIS — M5136 Other intervertebral disc degeneration, lumbar region: Secondary | ICD-10-CM | POA: Diagnosis not present

## 2021-12-14 DIAGNOSIS — S39012A Strain of muscle, fascia and tendon of lower back, initial encounter: Secondary | ICD-10-CM | POA: Diagnosis not present

## 2021-12-14 DIAGNOSIS — M9903 Segmental and somatic dysfunction of lumbar region: Secondary | ICD-10-CM | POA: Diagnosis not present

## 2021-12-14 DIAGNOSIS — M9905 Segmental and somatic dysfunction of pelvic region: Secondary | ICD-10-CM | POA: Diagnosis not present

## 2021-12-21 DIAGNOSIS — R1031 Right lower quadrant pain: Secondary | ICD-10-CM | POA: Diagnosis not present

## 2021-12-28 DIAGNOSIS — S39012A Strain of muscle, fascia and tendon of lower back, initial encounter: Secondary | ICD-10-CM | POA: Diagnosis not present

## 2021-12-28 DIAGNOSIS — M5136 Other intervertebral disc degeneration, lumbar region: Secondary | ICD-10-CM | POA: Diagnosis not present

## 2021-12-28 DIAGNOSIS — M9903 Segmental and somatic dysfunction of lumbar region: Secondary | ICD-10-CM | POA: Diagnosis not present

## 2021-12-28 DIAGNOSIS — M47814 Spondylosis without myelopathy or radiculopathy, thoracic region: Secondary | ICD-10-CM | POA: Diagnosis not present

## 2021-12-28 DIAGNOSIS — M9905 Segmental and somatic dysfunction of pelvic region: Secondary | ICD-10-CM | POA: Diagnosis not present

## 2021-12-28 DIAGNOSIS — M9902 Segmental and somatic dysfunction of thoracic region: Secondary | ICD-10-CM | POA: Diagnosis not present

## 2022-01-11 DIAGNOSIS — S39012A Strain of muscle, fascia and tendon of lower back, initial encounter: Secondary | ICD-10-CM | POA: Diagnosis not present

## 2022-01-11 DIAGNOSIS — M9905 Segmental and somatic dysfunction of pelvic region: Secondary | ICD-10-CM | POA: Diagnosis not present

## 2022-01-11 DIAGNOSIS — M9903 Segmental and somatic dysfunction of lumbar region: Secondary | ICD-10-CM | POA: Diagnosis not present

## 2022-01-11 DIAGNOSIS — M47814 Spondylosis without myelopathy or radiculopathy, thoracic region: Secondary | ICD-10-CM | POA: Diagnosis not present

## 2022-01-11 DIAGNOSIS — M5136 Other intervertebral disc degeneration, lumbar region: Secondary | ICD-10-CM | POA: Diagnosis not present

## 2022-01-11 DIAGNOSIS — M9902 Segmental and somatic dysfunction of thoracic region: Secondary | ICD-10-CM | POA: Diagnosis not present

## 2023-02-13 ENCOUNTER — Other Ambulatory Visit: Payer: Self-pay

## 2023-02-13 ENCOUNTER — Emergency Department (HOSPITAL_COMMUNITY): Payer: Medicare Other

## 2023-02-13 ENCOUNTER — Encounter (HOSPITAL_COMMUNITY): Payer: Self-pay | Admitting: *Deleted

## 2023-02-13 ENCOUNTER — Emergency Department (HOSPITAL_COMMUNITY)
Admission: EM | Admit: 2023-02-13 | Discharge: 2023-02-13 | Disposition: A | Discharge status: Home / Self Care | Payer: Medicare Other | Attending: Emergency Medicine | Admitting: Emergency Medicine

## 2023-02-13 DIAGNOSIS — I2510 Atherosclerotic heart disease of native coronary artery without angina pectoris: Secondary | ICD-10-CM | POA: Insufficient documentation

## 2023-02-13 DIAGNOSIS — R000 Tachycardia, unspecified: Secondary | ICD-10-CM | POA: Insufficient documentation

## 2023-02-13 DIAGNOSIS — I10 Essential (primary) hypertension: Secondary | ICD-10-CM | POA: Insufficient documentation

## 2023-02-13 DIAGNOSIS — R0989 Other specified symptoms and signs involving the circulatory and respiratory systems: Secondary | ICD-10-CM | POA: Diagnosis not present

## 2023-02-13 DIAGNOSIS — R42 Dizziness and giddiness: Secondary | ICD-10-CM | POA: Diagnosis not present

## 2023-02-13 DIAGNOSIS — Z7902 Long term (current) use of antithrombotics/antiplatelets: Secondary | ICD-10-CM | POA: Insufficient documentation

## 2023-02-13 DIAGNOSIS — Z7982 Long term (current) use of aspirin: Secondary | ICD-10-CM | POA: Diagnosis not present

## 2023-02-13 DIAGNOSIS — E871 Hypo-osmolality and hyponatremia: Secondary | ICD-10-CM | POA: Diagnosis not present

## 2023-02-13 DIAGNOSIS — R030 Elevated blood-pressure reading, without diagnosis of hypertension: Secondary | ICD-10-CM | POA: Diagnosis not present

## 2023-02-13 DIAGNOSIS — Z79899 Other long term (current) drug therapy: Secondary | ICD-10-CM | POA: Diagnosis not present

## 2023-02-13 DIAGNOSIS — Z955 Presence of coronary angioplasty implant and graft: Secondary | ICD-10-CM | POA: Diagnosis not present

## 2023-02-13 LAB — CBC
HCT: 50.1 % (ref 39.0–52.0)
Hemoglobin: 17 g/dL (ref 13.0–17.0)
MCH: 30.4 pg (ref 26.0–34.0)
MCHC: 33.9 g/dL (ref 30.0–36.0)
MCV: 89.6 fL (ref 80.0–100.0)
Platelets: 367 10*3/uL (ref 150–400)
RBC: 5.59 MIL/uL (ref 4.22–5.81)
RDW: 12.3 % (ref 11.5–15.5)
WBC: 8.4 10*3/uL (ref 4.0–10.5)
nRBC: 0 % (ref 0.0–0.2)

## 2023-02-13 LAB — BASIC METABOLIC PANEL
Anion gap: 16 — ABNORMAL HIGH (ref 5–15)
BUN: 21 mg/dL (ref 8–23)
CO2: 16 mmol/L — ABNORMAL LOW (ref 22–32)
Calcium: 9.8 mg/dL (ref 8.9–10.3)
Chloride: 96 mmol/L — ABNORMAL LOW (ref 98–111)
Creatinine, Ser: 1.26 mg/dL — ABNORMAL HIGH (ref 0.61–1.24)
GFR, Estimated: >60 mL/min (ref >60)
Glucose, Bld: 108 mg/dL — ABNORMAL HIGH (ref 70–99)
Potassium: 3.8 mmol/L (ref 3.5–5.1)
Sodium: 128 mmol/L — ABNORMAL LOW (ref 135–145)

## 2023-02-13 LAB — TROPONIN I (HIGH SENSITIVITY): Troponin I (High Sensitivity): 8 ng/L (ref <18)

## 2023-02-13 MED ORDER — METOPROLOL TARTRATE 25 MG PO TABS: 25.0000 mg | ORAL_TABLET | Freq: Every day | ORAL | 0 refills | Status: AC

## 2023-02-13 MED ORDER — METOPROLOL TARTRATE 25 MG PO TABS
25.0000 mg | ORAL_TABLET | Freq: Once | ORAL | Status: AC
  Administered 2023-02-13: 25 mg via ORAL
  Filled 2023-02-13: qty 1

## 2023-02-13 NOTE — Discharge Instructions (Signed)
A new prescription for your metoprolol was sent to the pharmacy.  If you develop chest pain, shortness of breath, passing out, weakness, falls or confusion return to the ER.

## 2023-02-13 NOTE — ED Provider Notes (Addendum)
[place] EMERGENCY DEPARTMENT AT [facility] Provider Note   CSN: [id number] Arrival date & time: 02/13/23  1704     History  Chief Complaint  Patient presents with   Dizziness   Hypertension    [name] is a 72 y.o. male.  Patient is a 72 year old male with a history of CAD status post stent in 2021, chronic right bundle branch block, hypertension on metoprolol, lisinopril, amlodipine and Imdur who is presenting today with complaints of a mild headache, his head feeling foggy and his heart racing.  He reports he noticed it around noon today but it has been building.  He has not had any chest pain or shortness of breath.  He has been compliant with his medications except for he ran out of his metoprolol 25 mg 4 days ago because to get a refill he has to follow-up with his doctor and he has not been able to do that yet.  Otherwise he is compliant with his other medications.  However today when he started feeling this way he checked his blood pressure and it was elevated at home.  He reports over the week his blood pressure usually is good about 120/70 or 80.  He has not had anything salty to eat, denies any recent infectious symptoms or new medications.  No difficulty walking or with his balance.  No unilateral numbness or weakness on one side.  No vomiting, diarrhea or abdominal pain.  Earlier he said he had a little bit of heartburn but that resolved on its own and he denies any heartburn or nausea now.  The history is provided by the patient and medical records.  Dizziness Hypertension       Home Medications Prior to Admission medications   Medication Sig Start Date End Date Taking? Authorizing Provider  metoprolol tartrate (LOPRESSOR) 25 MG tablet Take 1 tablet (25 mg total) by mouth daily. 02/13/23  Yes Dossie Ocanas, Alphonzo Lemmings, MD  amLODipine (NORVASC) 5 MG tablet Take 5 mg by mouth every evening.     [provider]  aspirin EC 81 MG tablet Take 81 mg by  mouth daily.    [provider]  clopidogrel (PLAVIX) 75 MG tablet Take 1 tablet (75 mg total) by mouth daily with breakfast. Patient not taking: Reported on 04/14/2021 03/09/13   Rinaldo Cloud, MD  colchicine 0.6 MG tablet Take 0.6 mg by mouth daily as needed (for gout).  05/31/16   [provider]  famotidine (PEPCID) 20 MG tablet Take 1 tablet (20 mg total) by mouth 2 (two) times daily as needed for heartburn. 07/06/16   Orpah Cobb, MD  ibuprofen (ADVIL) 800 MG tablet Take 1 tablet (800 mg total) by mouth every 8 (eight) hours as needed. 02/04/20   Kinsinger, De Blanch, MD  isosorbide mononitrate (IMDUR) 60 MG 24 hr tablet Take 60 mg by mouth daily.    [provider]  lisinopril (PRINIVIL,ZESTRIL) 10 MG tablet Take 1 tablet (10 mg total) by mouth daily. Patient taking differently: Take 10 mg by mouth daily. 03/05/15   Orpah Cobb, MD  nitroGLYCERIN (NITROSTAT) 0.4 MG SL tablet Place 1 tablet (0.4 mg total) under the tongue every 5 (five) minutes as needed for chest pain. Patient not taking: No sig reported 11/12/13   Orpah Cobb, MD  oxyCODONE (OXY IR/ROXICODONE) 5 MG immediate release tablet Take 1 tablet (5 mg total) by mouth every 6 (six) hours as needed for severe pain. Patient not taking: Reported on 04/14/2021  02/04/20   Kinsinger, De Blanch, MD  ranolazine (RANEXA) 500 MG 12 hr tablet Take 500 mg by mouth 2 (two) times daily. 02/28/21   [provider]  rosuvastatin (CRESTOR) 40 MG tablet Take 40 mg by mouth at bedtime.     [provider]      Allergies    Patient has no known allergies.    Review of Systems   Review of Systems  Neurological:  Positive for dizziness.    Physical Exam Updated Vital Signs BP (!) 140/87   Pulse 74   Temp 97.9 F (36.6 C) (Oral)   Resp (!) 22   Ht 5\' 7"  (1.702 m)   Wt 77.1 kg   SpO2 94%   BMI 26.62 kg/m  Physical Exam Vitals and nursing note reviewed.  Constitutional:      General: He is not  in acute distress.    Appearance: He is well-developed.  HENT:     Head: Normocephalic and atraumatic.  Eyes:     Conjunctiva/sclera: Conjunctivae normal.     Pupils: Pupils are equal, round, and reactive to light.  Cardiovascular:     Rate and Rhythm: Regular rhythm. Tachycardia present.     Heart sounds: No murmur heard. Pulmonary:     Effort: Pulmonary effort is normal. No respiratory distress.     Breath sounds: Normal breath sounds. No wheezing or rales.  Abdominal:     General: There is no distension.     Palpations: Abdomen is soft.     Tenderness: There is no abdominal tenderness. There is no guarding or rebound.  Musculoskeletal:        General: No tenderness. Normal range of motion.     Cervical back: Normal range of motion and neck supple.  Skin:    General: Skin is warm and dry.     Findings: No erythema or rash.  Neurological:     Mental Status: He is alert and oriented to person, place, and time. Mental status is at baseline.     Sensory: No sensory deficit.     Motor: No weakness.     Coordination: Coordination normal.     Gait: Gait normal.  Psychiatric:        Behavior: Behavior normal.     ED Results / Procedures / Treatments   Labs (all labs ordered are listed, but only abnormal results are displayed) Labs Reviewed  BASIC METABOLIC PANEL - Abnormal; Notable for the following components:      Result Value   Sodium 128 (*)    Chloride 96 (*)    CO2 16 (*)    Glucose, Bld 108 (*)    Creatinine, Ser 1.26 (*)    Anion gap 16 (*)    All other components within normal limits  CBC  TROPONIN I (HIGH SENSITIVITY)  TROPONIN I (HIGH SENSITIVITY)    EKG EKG Interpretation Date/Time:  Wednesday February 13 2023 17:22:49 EDT Ventricular Rate:  107 PR Interval:  150 QRS Duration:  120 QT Interval:  364 QTC Calculation: 485 R Axis:   -87  Text Interpretation: Sinus tachycardia with Premature atrial complexes Left axis deviation Low voltage QRS Right  bundle branch block Cannot rule out Anterior infarct , age undetermined No significant change since last tracing When compared with ECG of 14-Apr-2021 04:58, PREVIOUS ECG IS PRESENT Confirmed by Gwyneth Sprout ([id number]) on 02/13/2023 6:36:42 PM  Radiology DG Chest 2 View  Result Date: 02/13/2023 CLINICAL DATA:  Dizziness and elevated blood  pressure. EXAM: CHEST - 2 VIEW COMPARISON:  April 14, 2021 FINDINGS: The heart size and mediastinal contours are within normal limits. Coronary artery stents are in place. Low lung volumes are noted. Both lungs are clear. The visualized skeletal structures are unremarkable. IMPRESSION: No active cardiopulmonary disease. Electronically Signed   By: Aram Candela M.D.   On: 02/13/2023 19:32    Procedures Procedures    Medications Ordered in ED Medications  metoprolol tartrate (LOPRESSOR) tablet 25 mg (25 mg Oral Given 02/13/23 1924)    ED Course/ Medical Decision Making/ A&P                                 Medical Decision Making Amount and/or Complexity of Data Reviewed External Data Reviewed: notes. Labs: ordered. Decision-making details documented in ED Course. Radiology: ordered and independent interpretation performed. Decision-making details documented in ED Course. ECG/medicine tests: ordered and independent interpretation performed. Decision-making details documented in ED Course.  Risk Prescription drug management.   Pt with multiple medical problems and comorbidities and presenting today with a complaint that caries a high risk for morbidity and mortality.  Here today with headache, feeling off and hypertension and tachycardia.  Patient is well-appearing on exam neuroexam is normal.  Suspect patient's symptoms are related to running out of his metoprolol and having reflux tachycardia from lack of beta-blocker as well as lack of beta-blocker having elevated blood pressure.  Patient was given his dose of metoprolol.  Will ensure no acute  cardiac issues, new AKI or electrolyte abnormalities.  No findings to suggest stroke today patient does not seem to need a CT of his brain as he is neurologically intact. 9:28 PM I independently interpreted patient's EKG and labs.  EKG today with sinus tachy and RBBB which is known.  Labs with hyponatremia of 128 (129 2 years ago), normal trop and CBC.  Cr baseline at 1.26.  I have independently visualized and interpreted pt's images today.  CXR wnl.   On repeat eval after pt's home dose of metoprolol Hr now in the 70's and BP is improving and he is due for his evening medicines.  Pt unaware of chronic hyponatremia but denies excessive water intake, heavy beer consumption and no reason for dehydration.  Will have pt f/u with dr Algie Coffer and have repeat Na evaluated.  Currently stable for d/c and does not meet criteria for admission at this time.        Final Clinical Impression(s) / ED Diagnoses Final diagnoses:  Sinus tachycardia  Hyponatremia    Rx / DC Orders ED Discharge Orders          Ordered    metoprolol tartrate (LOPRESSOR) 25 MG tablet  Daily        02/13/23 2128              Gwyneth Sprout, MD 02/13/23 2128    Gwyneth Sprout, MD 02/13/23 2128

## 2023-02-13 NOTE — ED Triage Notes (Signed)
The pt is c/o being dizzy and some indigestion since this afternoon

## 2023-02-19 DIAGNOSIS — Z9861 Coronary angioplasty status: Secondary | ICD-10-CM | POA: Diagnosis not present

## 2023-02-19 DIAGNOSIS — I10 Essential (primary) hypertension: Secondary | ICD-10-CM | POA: Diagnosis not present

## 2023-02-19 DIAGNOSIS — I2510 Atherosclerotic heart disease of native coronary artery without angina pectoris: Secondary | ICD-10-CM | POA: Diagnosis not present

## 2023-02-26 ENCOUNTER — Other Ambulatory Visit: Payer: Self-pay | Admitting: Cardiovascular Disease

## 2023-02-26 ENCOUNTER — Ambulatory Visit
Admission: RE | Admit: 2023-02-26 | Discharge: 2023-02-26 | Disposition: A | Discharge status: Home / Self Care | Payer: Medicare Other | Source: Ambulatory Visit | Attending: Cardiovascular Disease | Admitting: Cardiovascular Disease

## 2023-02-26 DIAGNOSIS — R519 Headache, unspecified: Secondary | ICD-10-CM

## 2023-02-26 DIAGNOSIS — Z9861 Coronary angioplasty status: Secondary | ICD-10-CM | POA: Diagnosis not present

## 2023-02-26 DIAGNOSIS — I10 Essential (primary) hypertension: Secondary | ICD-10-CM | POA: Diagnosis not present

## 2023-02-26 DIAGNOSIS — I2510 Atherosclerotic heart disease of native coronary artery without angina pectoris: Secondary | ICD-10-CM | POA: Diagnosis not present

## 2023-03-20 ENCOUNTER — Other Ambulatory Visit: Payer: Self-pay

## 2023-03-20 ENCOUNTER — Encounter (HOSPITAL_COMMUNITY): Payer: Self-pay | Admitting: Emergency Medicine

## 2023-03-20 ENCOUNTER — Emergency Department (HOSPITAL_COMMUNITY): Payer: Medicare Other

## 2023-03-20 ENCOUNTER — Emergency Department (HOSPITAL_COMMUNITY)
Admission: EM | Admit: 2023-03-20 | Discharge: 2023-03-20 | Disposition: A | Discharge status: Home / Self Care | Payer: Medicare Other | Attending: Emergency Medicine | Admitting: Emergency Medicine

## 2023-03-20 DIAGNOSIS — Z7982 Long term (current) use of aspirin: Secondary | ICD-10-CM | POA: Diagnosis not present

## 2023-03-20 DIAGNOSIS — I10 Essential (primary) hypertension: Secondary | ICD-10-CM | POA: Diagnosis not present

## 2023-03-20 DIAGNOSIS — Z79899 Other long term (current) drug therapy: Secondary | ICD-10-CM | POA: Diagnosis not present

## 2023-03-20 DIAGNOSIS — E871 Hypo-osmolality and hyponatremia: Secondary | ICD-10-CM | POA: Insufficient documentation

## 2023-03-20 DIAGNOSIS — I2510 Atherosclerotic heart disease of native coronary artery without angina pectoris: Secondary | ICD-10-CM | POA: Diagnosis not present

## 2023-03-20 DIAGNOSIS — R0789 Other chest pain: Secondary | ICD-10-CM | POA: Insufficient documentation

## 2023-03-20 DIAGNOSIS — R079 Chest pain, unspecified: Secondary | ICD-10-CM | POA: Diagnosis not present

## 2023-03-20 LAB — BASIC METABOLIC PANEL
Anion gap: 11 (ref 5–15)
BUN: 21 mg/dL (ref 8–23)
CO2: 20 mmol/L — ABNORMAL LOW (ref 22–32)
Calcium: 9.3 mg/dL (ref 8.9–10.3)
Chloride: 97 mmol/L — ABNORMAL LOW (ref 98–111)
Creatinine, Ser: 1.32 mg/dL — ABNORMAL HIGH (ref 0.61–1.24)
GFR, Estimated: 57 mL/min — ABNORMAL LOW (ref >60)
Glucose, Bld: 103 mg/dL — ABNORMAL HIGH (ref 70–99)
Potassium: 4.5 mmol/L (ref 3.5–5.1)
Sodium: 128 mmol/L — ABNORMAL LOW (ref 135–145)

## 2023-03-20 LAB — CBC
HCT: 44 % (ref 39.0–52.0)
Hemoglobin: 15.1 g/dL (ref 13.0–17.0)
MCH: 30.6 pg (ref 26.0–34.0)
MCHC: 34.3 g/dL (ref 30.0–36.0)
MCV: 89.1 fL (ref 80.0–100.0)
Platelets: 358 10*3/uL (ref 150–400)
RBC: 4.94 MIL/uL (ref 4.22–5.81)
RDW: 13.4 % (ref 11.5–15.5)
WBC: 8 10*3/uL (ref 4.0–10.5)
nRBC: 0 % (ref 0.0–0.2)

## 2023-03-20 LAB — TROPONIN I (HIGH SENSITIVITY)
Troponin I (High Sensitivity): 5 ng/L (ref <18)
Troponin I (High Sensitivity): 4 ng/L (ref <18)

## 2023-03-20 MED ORDER — NITROGLYCERIN 0.4 MG SL SUBL: 0.4000 mg | SUBLINGUAL_TABLET | SUBLINGUAL | 3 refills | Status: DC | PRN

## 2023-03-20 MED ORDER — NITROGLYCERIN 0.4 MG SL SUBL: 0.4000 mg | SUBLINGUAL_TABLET | SUBLINGUAL | 3 refills | Status: AC | PRN

## 2023-03-20 NOTE — Discharge Instructions (Signed)
Please follow-up with your cardiologist in regards to recent symptoms and ER visit.  Today your exam is reassuring your labs were also reassuring.  As we discussed continue taking her aspirin as prescribed however I will also add an nitroglycerin for you to take for any chest pain you may have.  Please take this medication when you are having chest pain every 5 minutes.  If you need to take it once please call 911.  You may take it every 5 minutes for up to 3 times.  This medication will lower your blood pressure so please be aware that.  I have contacted your cardiologist to make him aware of your visit but please call their office to schedule an appointment.  If symptoms change or worsen please return to ER.

## 2023-03-20 NOTE — ED Triage Notes (Signed)
Awoken from sleep with L chest pain that radiates into L shoulder, "heaviness".  H/o of feeling this way with MI in past. Multiple stents.   Took 4 81mg  ASA

## 2023-03-20 NOTE — ED Provider Notes (Addendum)
[place] EMERGENCY DEPARTMENT AT [facility] Provider Note   CSN: [id number] Arrival date & time: 03/20/23  0202     History  Chief Complaint  Patient presents with   Chest Pain    [name] is a 72 y.o. male history of CAD, stent placement, right bundle wrench block, hypertension, hyperlipidemia, GERD, chronic stable angina presenting with chest pain that began at 1 AM this morning.  Chest pain symptoms lasted for a few moments and resolved after he took 4 x 81 mg aspirin.  Patient states that the chest pain is in the left side of his chest into his left shoulder but denies it radiating to his back, abdomen, neck.  Patient denies headache, vision changes, changes sensation/motor skills, fevers, abdominal pain, nausea/vomiting, leg swelling.  Patient is currently asymptomatic.  Chest pain feels like patient's chest pain from the past without any new features.  Last cath: 04/14/2021: Proximal LAD 40% stenosis, mid LAD 40% stenosed, Diag lesion 80%  Cardiologist: Orpah Cobb, MD  Home Medications Prior to Admission medications   Medication Sig Start Date End Date Taking? Authorizing Provider  amLODipine (NORVASC) 5 MG tablet Take 5 mg by mouth every evening.     [provider]  aspirin EC 81 MG tablet Take 81 mg by mouth daily.    [provider]  clopidogrel (PLAVIX) 75 MG tablet Take 1 tablet (75 mg total) by mouth daily with breakfast. Patient not taking: Reported on 04/14/2021 03/09/13   Rinaldo Cloud, MD  colchicine 0.6 MG tablet Take 0.6 mg by mouth daily as needed (for gout).  05/31/16   [provider]  famotidine (PEPCID) 20 MG tablet Take 1 tablet (20 mg total) by mouth 2 (two) times daily as needed for heartburn. 07/06/16   Orpah Cobb, MD  ibuprofen (ADVIL) 800 MG tablet Take 1 tablet (800 mg total) by mouth every 8 (eight) hours as needed. 02/04/20   Kinsinger, De Blanch, MD  isosorbide mononitrate (IMDUR) 60 MG 24 hr tablet  Take 60 mg by mouth daily.    [provider]  lisinopril (PRINIVIL,ZESTRIL) 10 MG tablet Take 1 tablet (10 mg total) by mouth daily. Patient taking differently: Take 10 mg by mouth daily. 03/05/15   Orpah Cobb, MD  metoprolol tartrate (LOPRESSOR) 25 MG tablet Take 1 tablet (25 mg total) by mouth daily. 02/13/23   Gwyneth Sprout, MD  nitroGLYCERIN (NITROSTAT) 0.4 MG SL tablet Place 1 tablet (0.4 mg total) under the tongue every 5 (five) minutes as needed for chest pain. 03/20/23   Netta Corrigan, PA-C  oxyCODONE (OXY IR/ROXICODONE) 5 MG immediate release tablet Take 1 tablet (5 mg total) by mouth every 6 (six) hours as needed for severe pain. Patient not taking: Reported on 04/14/2021 02/04/20   Kinsinger, De Blanch, MD  ranolazine (RANEXA) 500 MG 12 hr tablet Take 500 mg by mouth 2 (two) times daily. 02/28/21   [provider]  rosuvastatin (CRESTOR) 40 MG tablet Take 40 mg by mouth at bedtime.     [provider]      Allergies    Patient has no known allergies.    Review of Systems   Review of Systems  Cardiovascular:  Positive for chest pain.    Physical Exam Updated Vital Signs BP (!) 141/82   Pulse 73   Temp 97.6 F (36.4 C) (Oral)   Resp 17   Wt 77.1 kg   SpO2 95%   BMI 26.63 kg/m  Physical Exam Vitals reviewed.  Constitutional:      General: He is not in acute distress. HENT:     Head: Normocephalic and atraumatic.  Eyes:     Extraocular Movements: Extraocular movements intact.     Conjunctiva/sclera: Conjunctivae normal.     Pupils: Pupils are equal, round, and reactive to light.  Cardiovascular:     Rate and Rhythm: Normal rate and regular rhythm.     Pulses: Normal pulses.     Heart sounds: Normal heart sounds.     Comments: 2+ bilateral radial/dorsalis pedis pulses with regular rate Pulmonary:     Effort: Pulmonary effort is normal. No respiratory distress.     Breath sounds: Normal breath sounds.  Chest:     Chest wall: No  tenderness.  Abdominal:     Palpations: Abdomen is soft.     Tenderness: There is no abdominal tenderness. There is no guarding or rebound.  Musculoskeletal:        General: Normal range of motion.     Cervical back: Normal range of motion and neck supple.     Right lower leg: No tenderness. No edema.     Left lower leg: No tenderness. No edema.     Comments: 5 out of 5 bilateral grip/leg extension strength  Skin:    General: Skin is warm and dry.     Capillary Refill: Capillary refill takes less than 2 seconds.  Neurological:     General: No focal deficit present.     Mental Status: He is alert and oriented to person, place, and time.     Comments: Sensation intact in all 4 limbs  Psychiatric:        Mood and Affect: Mood normal.     ED Results / Procedures / Treatments   Labs (all labs ordered are listed, but only abnormal results are displayed) Labs Reviewed  BASIC METABOLIC PANEL - Abnormal; Notable for the following components:      Result Value   Sodium 128 (*)    Chloride 97 (*)    CO2 20 (*)    Glucose, Bld 103 (*)    Creatinine, Ser 1.32 (*)    GFR, Estimated 57 (*)    All other components within normal limits  CBC  TROPONIN I (HIGH SENSITIVITY)  TROPONIN I (HIGH SENSITIVITY)    EKG EKG Interpretation Date/Time:  Wednesday March 20 2023 02:19:33 EDT Ventricular Rate:  80 PR Interval:  170 QRS Duration:  122 QT Interval:  422 QTC Calculation: 486 R Axis:   -64  Text Interpretation: Normal sinus rhythm Possible Left atrial enlargement Left axis deviation Right bundle branch block Abnormal ECG No significant change since last tracing Confirmed by Zadie Rhine ([id number]) on 03/20/2023 2:25:53 AM  Radiology DG Chest 2 View  Result Date: 03/20/2023 CLINICAL DATA:  Chest pain EXAM: CHEST - 2 VIEW COMPARISON:  02/13/2023 FINDINGS: The heart size and mediastinal contours are within normal limits. Both lungs are clear. The visualized skeletal structures are  unremarkable. IMPRESSION: No active cardiopulmonary disease. Electronically Signed   By: Helyn Numbers M.D.   On: 03/20/2023 02:39    Procedures Procedures    Medications Ordered in ED Medications - No data to display  ED Course/ Medical Decision Making/ A&P                                 Medical Decision Making Risk Prescription drug  management.   [name] 72 y.o. presented today for chest pain. Working DDx that I considered at this time includes, but not limited to, ACS, GERD, pulmonary embolism, community-acquired pneumonia, aortic dissection, pneumothorax, underlying bony abnormality, anemia, thyrotoxicosis, esophageal rupture, CHF exacerbation, valvular disorder, myocarditis, pericarditis, endocarditis, pericardial effusion/cardiac tamponade, pulmonary edema, gastritis/PUD, esophagitis.  R/o Dx: ACS, GERD, pulmonary embolism, community-acquired pneumonia, aortic dissection, pneumothorax, underlying bony abnormality, anemia, thyrotoxicosis, esophageal rupture, CHF exacerbation, valvular disorder, myocarditis, pericarditis, endocarditis, pericardial effusion/cardiac tamponade, pulmonary edema, gastritis/PUD, esophagitis: These are considered less likely due to history of present illness and physical exam findings. Aortic Dissection: less likely based on the location, quality, onset, and severity of symptoms in this case. Patient also has a lack of underlying history of AD or TAA.   Review of prior external notes: 02/13/2023 ED  Unique Tests and My Interpretation:  EKG: Rate, rhythm, axis, intervals all examined and without medically relevant abnormality. ST segments without concerns for elevations Troponin: 5, 4 CXR: No acute changes CBC: Unremarkable BMP: Hyponatremia 128 however this appears stable and chronic  Discussion with Independent Historian: None  Discussion of Management of Tests:  none  Risk: Medium: prescription drug management  Risk Stratification  Score: None  Staffed with Mesner, MD  Plan: On exam patient was in no acute distress with stable vitals. Patient's physical was unremarkable.  Patient's labs in triage and imaging were all reassuring including delta troponins.  I spoke to the patient and he states that he is not having any chest pain and the chest pain resolved after taking aspirin this morning however he wanted to get checked out due to his cardiac history.  With patient's exam being reassuring along with the labs the patient I had shared decision making agreed to discharge on nitroglycerin as he does not have any at home.  Patient states he has not taken this in about 10 years and understands that this medication may drop his blood pressure when taken and that he can take it up to 3 times every 5 minutes for chest pain.  I spoke to the patient to call his cardiologist however will contact his cardiologist to update them of the visit.  Patient is okay with being discharged at this time and following up outpatient.  I have a high suspicion patient's chest pain is related to his chronic stable angina and are not concerned for any life-threatening diagnoses at this time.  Patient was given return precautions. Patient stable for discharge at this time.  Patient verbalized understanding of plan.  This chart was dictated using voice recognition software.  Despite best efforts to proofread,  errors can occur which can change the documentation meaning.         Final Clinical Impression(s) / ED Diagnoses Final diagnoses:  Atypical chest pain  Hyponatremia    Rx / DC Orders ED Discharge Orders          Ordered    nitroGLYCERIN (NITROSTAT) 0.4 MG SL tablet  Every 5 min PRN,   Status:  Discontinued        03/20/23 0649    nitroGLYCERIN (NITROSTAT) 0.4 MG SL tablet  Every 5 min PRN        03/20/23 0649              Netta Corrigan, PA-C 03/20/23 0659    Netta Corrigan, PA-C 03/20/23 4098    Marily Memos,  MD 03/20/23 [phone]

## 2023-03-20 NOTE — ED Notes (Signed)
Pt in NAD at d/c from ED. A&O. Ambulatory. Respirations even & unlabored. Skin warm & dry. Pt verbalized understanding of d/c teaching including follow up care, medications and reasons to return to the ED. No needs or questions expressed at d/c.

## 2023-04-09 DIAGNOSIS — I2510 Atherosclerotic heart disease of native coronary artery without angina pectoris: Secondary | ICD-10-CM | POA: Diagnosis not present

## 2023-04-09 DIAGNOSIS — R519 Headache, unspecified: Secondary | ICD-10-CM | POA: Diagnosis not present

## 2023-04-09 DIAGNOSIS — I10 Essential (primary) hypertension: Secondary | ICD-10-CM | POA: Diagnosis not present

## 2023-04-09 DIAGNOSIS — Z9861 Coronary angioplasty status: Secondary | ICD-10-CM | POA: Diagnosis not present

## 2023-05-31 ENCOUNTER — Other Ambulatory Visit: Payer: Self-pay

## 2023-05-31 ENCOUNTER — Encounter (HOSPITAL_COMMUNITY): Payer: Self-pay | Admitting: *Deleted

## 2023-05-31 ENCOUNTER — Emergency Department (HOSPITAL_COMMUNITY): Payer: Medicare Other

## 2023-05-31 ENCOUNTER — Emergency Department (HOSPITAL_COMMUNITY)
Admission: EM | Admit: 2023-05-31 | Discharge: 2023-05-31 | Disposition: A | Discharge status: Home / Self Care | Payer: Medicare Other | Attending: Emergency Medicine | Admitting: Emergency Medicine

## 2023-05-31 DIAGNOSIS — R079 Chest pain, unspecified: Secondary | ICD-10-CM

## 2023-05-31 DIAGNOSIS — I2510 Atherosclerotic heart disease of native coronary artery without angina pectoris: Secondary | ICD-10-CM | POA: Diagnosis not present

## 2023-05-31 DIAGNOSIS — I10 Essential (primary) hypertension: Secondary | ICD-10-CM | POA: Diagnosis not present

## 2023-05-31 DIAGNOSIS — Z7982 Long term (current) use of aspirin: Secondary | ICD-10-CM | POA: Diagnosis not present

## 2023-05-31 DIAGNOSIS — R0789 Other chest pain: Secondary | ICD-10-CM | POA: Insufficient documentation

## 2023-05-31 DIAGNOSIS — R9389 Abnormal findings on diagnostic imaging of other specified body structures: Secondary | ICD-10-CM | POA: Diagnosis not present

## 2023-05-31 DIAGNOSIS — Z955 Presence of coronary angioplasty implant and graft: Secondary | ICD-10-CM | POA: Diagnosis not present

## 2023-05-31 LAB — CBC WITH DIFFERENTIAL/PLATELET
Abs Immature Granulocytes: 0.08 10*3/uL — ABNORMAL HIGH (ref 0.00–0.07)
Basophils Absolute: 0.1 10*3/uL (ref 0.0–0.1)
Basophils Relative: 1 %
Eosinophils Absolute: 0.1 10*3/uL (ref 0.0–0.5)
Eosinophils Relative: 2 %
HCT: 44.9 % (ref 39.0–52.0)
Hemoglobin: 15.4 g/dL (ref 13.0–17.0)
Immature Granulocytes: 1 %
Lymphocytes Relative: 32 %
Lymphs Abs: 2.4 10*3/uL (ref 0.7–4.0)
MCH: 32.2 pg (ref 26.0–34.0)
MCHC: 34.3 g/dL (ref 30.0–36.0)
MCV: 93.9 fL (ref 80.0–100.0)
Monocytes Absolute: 1.1 10*3/uL — ABNORMAL HIGH (ref 0.1–1.0)
Monocytes Relative: 15 %
Neutro Abs: 3.9 10*3/uL (ref 1.7–7.7)
Neutrophils Relative %: 49 %
Platelets: 225 10*3/uL (ref 150–400)
RBC: 4.78 MIL/uL (ref 4.22–5.81)
RDW: 13.1 % (ref 11.5–15.5)
WBC: 7.7 10*3/uL (ref 4.0–10.5)
nRBC: 0 % (ref 0.0–0.2)

## 2023-05-31 LAB — BASIC METABOLIC PANEL
Anion gap: 11 (ref 5–15)
BUN: 17 mg/dL (ref 8–23)
CO2: 23 mmol/L (ref 22–32)
Calcium: 9.5 mg/dL (ref 8.9–10.3)
Chloride: 98 mmol/L (ref 98–111)
Creatinine, Ser: 1.66 mg/dL — ABNORMAL HIGH (ref 0.61–1.24)
GFR, Estimated: 43 mL/min — ABNORMAL LOW (ref >60)
Glucose, Bld: 100 mg/dL — ABNORMAL HIGH (ref 70–99)
Potassium: 4.6 mmol/L (ref 3.5–5.1)
Sodium: 132 mmol/L — ABNORMAL LOW (ref 135–145)

## 2023-05-31 LAB — D-DIMER, QUANTITATIVE: D-Dimer, Quant: <0.27 ug{FEU}/mL (ref 0.00–0.50)

## 2023-05-31 LAB — TROPONIN I (HIGH SENSITIVITY)
Troponin I (High Sensitivity): 5 ng/L (ref <18)
Troponin I (High Sensitivity): 4 ng/L (ref <18)

## 2023-05-31 MED ORDER — ACETAMINOPHEN 500 MG PO TABS
1000.0000 mg | ORAL_TABLET | ORAL | Status: AC
  Administered 2023-05-31: 1000 mg via ORAL
  Filled 2023-05-31: qty 2

## 2023-05-31 NOTE — Discharge Instructions (Signed)
You were seen for your chest pain in the emergency department.   At home, please take tylenol for your pain.    Follow-up with your primary doctor in 2-3 days regarding your visit. Follow-up with your cardiologist as well.   Return immediately to the emergency department if you experience any of the following: Worsening pain, difficulty breathing, unexplained vomiting or sweating, or any other concerning symptoms.    Thank you for visiting our Emergency Department. It was a pleasure taking care of you today.

## 2023-05-31 NOTE — ED Provider Notes (Signed)
[place] EMERGENCY DEPARTMENT AT [facility] Provider Note   CSN: [id number] Arrival date & time: 05/31/23  0431     History  Chief Complaint  Patient presents with   Chest Pain    [name] is a 72 y.o. male.  72 year old male with a history of CAD status post PCI, right bundle branch block, hypertension, and hyperlipidemia who presents to the emergency department chest discomfort.  Says at 3:15 AM this morning he woke up with sharp substernal chest pain.  No radiation.  Worsened with breathing. Not positional or exertional. No diaphoresis or vomiting. No SOB or cough. No recent surgery or hx of cancer, DVT or PE. Took aspirin and nitro without significant improvement so decided to come into the ED. Takes aspirin but no Plavix or other blood thinners.  Last cath was in 2022 which showed 80% diagonal lesion with 40% proximal and mid LAD lesions.       Home Medications Prior to Admission medications   Medication Sig Start Date End Date Taking? Authorizing Provider  amLODipine (NORVASC) 5 MG tablet Take 5 mg by mouth every evening.    Yes [provider]  aspirin EC 81 MG tablet Take 81 mg by mouth daily.   Yes [provider]  Coenzyme Q10 (Q-10 CO-ENZYME PO) Take 1 tablet by mouth daily.   Yes [provider]  famotidine (PEPCID) 20 MG tablet Take 1 tablet (20 mg total) by mouth 2 (two) times daily as needed for heartburn. 07/06/16  Yes Orpah Cobb, MD  isosorbide mononitrate (IMDUR) 60 MG 24 hr tablet Take 60 mg by mouth daily.   Yes [provider]  lisinopril (PRINIVIL,ZESTRIL) 10 MG tablet Take 1 tablet (10 mg total) by mouth daily. Patient taking differently: Take 10 mg by mouth daily. 03/05/15  Yes Orpah Cobb, MD  metoprolol tartrate (LOPRESSOR) 25 MG tablet Take 1 tablet (25 mg total) by mouth daily. 02/13/23  Yes Gwyneth Sprout, MD  nitroGLYCERIN (NITROSTAT) 0.4 MG SL tablet Place 1 tablet (0.4 mg total) under the  tongue every 5 (five) minutes as needed for chest pain. 03/20/23  Yes Netta Corrigan, PA-C  ranolazine (RANEXA) 500 MG 12 hr tablet Take 500 mg by mouth 2 (two) times daily. 02/28/21  Yes [provider]  rosuvastatin (CRESTOR) 20 MG tablet Take 20 mg by mouth daily. 05/17/23  Yes [provider]  hydrALAZINE (APRESOLINE) 10 MG tablet Take 10 mg by mouth 2 (two) times daily. Patient not taking: Reported on 05/31/2023 02/19/23   [provider]      Allergies    Patient has no known allergies.    Review of Systems   Review of Systems  Physical Exam Updated Vital Signs BP (!) 153/82   Pulse 72   Temp 97.6 F (36.4 C) (Oral)   Resp 16   Ht 5\' 7"  (1.702 m)   Wt 77.1 kg   SpO2 97%   BMI 26.63 kg/m  Physical Exam Vitals and nursing note reviewed.  Constitutional:      General: He is not in acute distress.    Appearance: He is well-developed.  HENT:     Head: Normocephalic and atraumatic.     Right Ear: External ear normal.     Left Ear: External ear normal.     Nose: Nose normal.  Eyes:     Extraocular Movements: Extraocular movements intact.     Conjunctiva/sclera: Conjunctivae normal.     Pupils: Pupils are  equal, round, and reactive to light.  Cardiovascular:     Rate and Rhythm: Normal rate and regular rhythm.     Heart sounds: Normal heart sounds.     Comments: Chest pain not reproducible.  Radial pulses 2+ bilaterally. Pulmonary:     Effort: Pulmonary effort is normal. No respiratory distress.     Breath sounds: Normal breath sounds.  Musculoskeletal:     Cervical back: Normal range of motion and neck supple.     Right lower leg: Edema (1+) present.     Left lower leg: Edema (1+) present.  Skin:    General: Skin is warm and dry.  Neurological:     Mental Status: He is alert. Mental status is at baseline.  Psychiatric:        Mood and Affect: Mood normal.        Behavior: Behavior normal.     ED Results / Procedures / Treatments    Labs (all labs ordered are listed, but only abnormal results are displayed) Labs Reviewed  CBC WITH DIFFERENTIAL/PLATELET - Abnormal; Notable for the following components:      Result Value   Monocytes Absolute 1.1 (*)    Abs Immature Granulocytes 0.08 (*)    All other components within normal limits  BASIC METABOLIC PANEL - Abnormal; Notable for the following components:   Sodium 132 (*)    Glucose, Bld 100 (*)    Creatinine, Ser 1.66 (*)    GFR, Estimated 43 (*)    All other components within normal limits  D-DIMER, QUANTITATIVE (NOT AT [facility])  TROPONIN I (HIGH SENSITIVITY)  TROPONIN I (HIGH SENSITIVITY)    EKG EKG Interpretation Date/Time:  Friday May 31 2023 13:09:21 EST Ventricular Rate:  83 PR Interval:  175 QRS Duration:  134 QT Interval:  405 QTC Calculation: 476 R Axis:   -74  Text Interpretation: Sinus rhythm RBBB and LAFB Confirmed by Vonita Moss [phone]) on 05/31/2023 1:13:27 PM  Radiology DG Chest 2 View Result Date: 05/31/2023 CLINICAL DATA:  Chest pain EXAM: CHEST - 2 VIEW COMPARISON:  03/20/2023 FINDINGS: Chronic elevation of the right diaphragm to a mild degree. Linear scar on the lateral view at the right lower lobe. There is no edema, consolidation, effusion, or pneumothorax. Normal heart size and mediastinal contours. Coronary stent noted. IMPRESSION: No active cardiopulmonary disease. Electronically Signed   By: Tiburcio Pea M.D.   On: 05/31/2023 06:18    Procedures Procedures    Medications Ordered in ED Medications  acetaminophen (TYLENOL) tablet 1,000 mg (1,000 mg Oral Given 05/31/23 1312)    ED Course/ Medical Decision Making/ A&P Clinical Course as of 05/31/23 2014  Fri May 31, 2023  1345 Dr Algie Coffer from cardiology consulted. If second trop is normal can follow-up in the office.  [RP]    Clinical Course User Index [RP] Rondel Baton, MD                                 Medical Decision Making Amount and/or Complexity  of Data Reviewed Labs: ordered.  Risk OTC drugs.   [name] is a 72 y.o. male with comorbidities that complicate the patient evaluation including CAD status post PCI, right bundle branch block, hypertension, and hyperlipidemia who presents to the emergency department chest discomfort.   Initial Ddx:  MI, PE, pneumonia, dissection, pericarditis, costochondritis, reflux  MDM:  With the patient's chest discomfort will obtain EKG  and troponins to evaluate for MI.  Also considering pulmonary embolism but patient is not high risk so we will obtain a D-dimer at this time.  Considered dissection but with their symmetric pulses, history, and description of the pain feel it is less likely.  If chest x-ray reveals widened mediastinum or any other concerning findings will consider CTA.  Also considered pericarditis but description is unlikely and they do not have risk factors for this diagnosis.  Chest pain not reproducible so feel it costochondritis less likely.  No infectious symptoms to suggest pneumonia at this time that would be causing pleuritic chest pain.  Plan:  Labs Troponin D-dimer EKG Chest x-ray  ED Summary/Re-evaluation:  EKG without acute ischemic findings.  Serial troponins WNL.  Discussed with the patient's cardiologist Dr. Algie Coffer who felt that the patient can follow-up as an outpatient.  D-dimer was also WNL and chest x-ray without acute abnormality or widened mediastinum.  Unclear exactly what is causing the patient's pain at this time but may be musculoskeletal.  Instructed to take Tylenol for the pain and return if it gets worse.  This patient presents to the ED for concern of complaints listed in HPI, this involves an extensive number of treatment options, and is a complaint that carries with it a high risk of complications and morbidity. Disposition including potential need for admission considered.   Dispo: DC Home. Return precautions discussed including, but not  limited to, those listed in the AVS. Allowed pt time to ask questions which were answered fully prior to dc.  Records reviewed Outpatient Clinic Notes The following labs were independently interpreted: Chemistry and show CKD I independently reviewed the following imaging with scope of interpretation limited to determining acute life threatening conditions related to emergency care: Chest x-ray and agree with the radiologist interpretation with the following exceptions: none I personally reviewed and interpreted cardiac monitoring: normal sinus rhythm  I personally reviewed and interpreted the pt's EKG: see above for interpretation  I have reviewed the patients home medications and made adjustments as needed Consults: Cardiology Social Determinants of health:  Elderly   Final Clinical Impression(s) / ED Diagnoses Final diagnoses:  Nonspecific chest pain    Rx / DC Orders ED Discharge Orders     None         Rondel Baton, MD 05/31/23 2014

## 2023-05-31 NOTE — ED Triage Notes (Signed)
States he woke with chest pain left anterior  describes as sharp. No change with movement or breathing , states he took 650 of ASA and ntg x 2 with relief.

## 2023-05-31 NOTE — ED Provider Triage Note (Signed)
Emergency Medicine Provider Triage Evaluation Note  [name] , a 72 y.o. male  was evaluated in triage.  Pt complains of chest pain. Woke him from sleep around 0315 this morning.  Pain sharp in the left chest.  No radiation reported.  No SOB, diaphoresis, nausea, or vomiting.  Hx of CAD with stents.  Had ASA and NTG PTA without relief.  Followed by Dr. Algie Coffer.  Review of Systems  Positive: Chest pain Negative: Fever, cough  Physical Exam  BP (!) 142/93   Pulse (!) 102   Temp 97.8 F (36.6 C)   Resp 17   Ht 5\' 7"  (1.702 m)   Wt 77.1 kg   SpO2 97%   BMI 26.63 kg/m  Gen:   Awake, no distress   Resp:  Normal effort  MSK:   Moves extremities without difficulty  Other:    Medical Decision Making  Medically screening exam initiated at 4:53 AM.  Appropriate orders placed.  Dallie Dad was informed that the remainder of the evaluation will be completed by another provider, this initial triage assessment does not replace that evaluation, and the importance of remaining in the ED until their evaluation is complete.  Chest pain.  Hx of CAD.  EKG, labs, CXR ordered.  VSS.   Garlon Hatchet, PA-C 05/31/23 [phone]

## 2023-08-05 DIAGNOSIS — Z9861 Coronary angioplasty status: Secondary | ICD-10-CM | POA: Diagnosis not present

## 2023-08-05 DIAGNOSIS — R519 Headache, unspecified: Secondary | ICD-10-CM | POA: Diagnosis not present

## 2023-08-05 DIAGNOSIS — I2510 Atherosclerotic heart disease of native coronary artery without angina pectoris: Secondary | ICD-10-CM | POA: Diagnosis not present

## 2023-08-05 DIAGNOSIS — I10 Essential (primary) hypertension: Secondary | ICD-10-CM | POA: Diagnosis not present

## 2023-10-22 DIAGNOSIS — D649 Anemia, unspecified: Secondary | ICD-10-CM | POA: Diagnosis not present

## 2023-10-22 DIAGNOSIS — E1165 Type 2 diabetes mellitus with hyperglycemia: Secondary | ICD-10-CM | POA: Diagnosis not present

## 2023-10-22 DIAGNOSIS — E876 Hypokalemia: Secondary | ICD-10-CM | POA: Diagnosis not present

## 2023-10-22 DIAGNOSIS — Z79899 Other long term (current) drug therapy: Secondary | ICD-10-CM | POA: Diagnosis not present

## 2023-10-22 DIAGNOSIS — E7849 Other hyperlipidemia: Secondary | ICD-10-CM | POA: Diagnosis not present

## 2023-11-04 DIAGNOSIS — I10 Essential (primary) hypertension: Secondary | ICD-10-CM | POA: Diagnosis not present

## 2023-11-04 DIAGNOSIS — I2510 Atherosclerotic heart disease of native coronary artery without angina pectoris: Secondary | ICD-10-CM | POA: Diagnosis not present

## 2023-11-04 DIAGNOSIS — Z9861 Coronary angioplasty status: Secondary | ICD-10-CM | POA: Diagnosis not present
